# Patient Record
Sex: Female | Born: 1990 | Race: Black or African American | Hispanic: No | Marital: Single | Smoking: Never smoker
Health system: Southern US, Community
[De-identification: ages and names within clinical notes are randomized; demographics above are authoritative.]

## PROBLEM LIST (undated history)

## (undated) DIAGNOSIS — J45909 Unspecified asthma, uncomplicated: Secondary | ICD-10-CM

---

## 2014-07-31 ENCOUNTER — Emergency Department (HOSPITAL_COMMUNITY)
Admission: EM | Admit: 2014-07-31 | Discharge: 2014-08-01 | Disposition: A | Discharge status: Home / Self Care | Payer: 59 | Attending: Emergency Medicine | Admitting: Emergency Medicine

## 2014-07-31 ENCOUNTER — Encounter (HOSPITAL_COMMUNITY): Payer: Self-pay | Admitting: *Deleted

## 2014-07-31 ENCOUNTER — Emergency Department (HOSPITAL_COMMUNITY): Payer: 59

## 2014-07-31 DIAGNOSIS — R0602 Shortness of breath: Secondary | ICD-10-CM | POA: Diagnosis present

## 2014-07-31 DIAGNOSIS — J45901 Unspecified asthma with (acute) exacerbation: Secondary | ICD-10-CM | POA: Insufficient documentation

## 2014-07-31 HISTORY — DX: Unspecified asthma, uncomplicated: J45.909

## 2014-07-31 MED ORDER — ALBUTEROL SULFATE (2.5 MG/3ML) 0.083% IN NEBU
5.0000 mg | INHALATION_SOLUTION | Freq: Once | RESPIRATORY_TRACT | Status: AC
  Administered 2014-07-31: 5 mg via RESPIRATORY_TRACT
  Filled 2014-07-31: qty 6

## 2014-07-31 MED ORDER — IPRATROPIUM-ALBUTEROL 0.5-2.5 (3) MG/3ML IN SOLN
3.0000 mL | Freq: Once | RESPIRATORY_TRACT | Status: AC
  Administered 2014-07-31: 3 mL via RESPIRATORY_TRACT
  Filled 2014-07-31: qty 3

## 2014-07-31 MED ORDER — IPRATROPIUM BROMIDE 0.02 % IN SOLN
0.5000 mg | Freq: Once | RESPIRATORY_TRACT | Status: AC
  Administered 2014-07-31: 0.5 mg via RESPIRATORY_TRACT
  Filled 2014-07-31: qty 2.5

## 2014-07-31 MED ORDER — PREDNISONE 20 MG PO TABS
60.0000 mg | ORAL_TABLET | Freq: Once | ORAL | Status: AC
  Administered 2014-07-31: 60 mg via ORAL
  Filled 2014-07-31: qty 3

## 2014-07-31 NOTE — ED Notes (Signed)
The pt is an asthmatic and for the past 2 weeks she has been having difficulty breathing and having  A cough non-productive also.  She has some chest discomfort and her inhaler has expired.  No distress at present.  lmp jan 1st

## 2014-07-31 NOTE — ED Provider Notes (Signed)
CSN: [id number]     Arrival date & time 07/31/14  2136 History   First MD Initiated Contact with Patient 07/31/14 2216     Chief Complaint  Patient presents with  . Shortness of Breath     (Consider location/radiation/quality/duration/timing/severity/associated sxs/prior Treatment) HPI Comments: Patient is a 24 yo F PMHx significant for asthma presenting to the ED for two weeks of cough, chest tightness, and wheezing with little to no improvement with her albuterol inhaler at home. No modifying factors identified. Denies any fevers, chills, nausea, vomiting, abdominal pain diarrhea. PERC negative.   Patient is a 24 y.o. female presenting with shortness of breath.  Shortness of Breath Associated symptoms: cough and wheezing     Past Medical History  Diagnosis Date  . Asthma    History reviewed. No pertinent past surgical history. No family history on file. History  Substance Use Topics  . Smoking status: Never Smoker   . Smokeless tobacco: Not on file  . Alcohol Use: Yes   OB History    No data available     Review of Systems  Respiratory: Positive for cough, chest tightness, shortness of breath and wheezing.   All other systems reviewed and are negative.     Allergies  Review of patient's allergies indicates no known allergies.  Home Medications   Prior to Admission medications   Medication Sig Start Date End Date Taking? Authorizing Provider  albuterol (PROVENTIL HFA;VENTOLIN HFA) 108 (90 BASE) MCG/ACT inhaler Inhale 2 puffs into the lungs every 6 (six) hours as needed for wheezing or shortness of breath. 08/01/14   Nilda Keathley L Kaysey Berndt, PA-C  predniSONE (DELTASONE) 20 MG tablet Take 2 tablets (40 mg total) by mouth daily. 08/01/14   Kashius Dominic L Ruven Corradi, PA-C   BP 140/83 mmHg  Pulse 90  Temp(Src) 98.9 F (37.2 C) (Oral)  Resp 20  Ht 5\' 6"  (1.676 m)  Wt 192 lb (87.091 kg)  BMI 31.00 kg/m2  SpO2 98%  LMP 07/17/2014 Physical Exam  Constitutional: She is  oriented to person, place, and time. She appears well-developed and well-nourished. No distress.  HENT:  Head: Normocephalic and atraumatic.  Right Ear: External ear normal.  Left Ear: External ear normal.  Nose: Nose normal.  Mouth/Throat: Oropharynx is clear and moist. No oropharyngeal exudate.  Eyes: Conjunctivae are normal.  Neck: Normal range of motion. Neck supple.  Cardiovascular: Normal rate, regular rhythm, normal heart sounds and intact distal pulses.   Pulmonary/Chest: Effort normal. No respiratory distress. She has wheezes (scant expiratory). She exhibits no tenderness.  Abdominal: Soft. There is no tenderness.  Musculoskeletal: Normal range of motion. She exhibits no edema.  Neurological: She is alert and oriented to person, place, and time.  Skin: Skin is warm and dry. She is not diaphoretic.  Psychiatric: She has a normal mood and affect.  Nursing note and vitals reviewed.   ED Course  Procedures (including critical care time) Medications  albuterol (PROVENTIL) (2.5 MG/3ML) 0.083% nebulizer solution 5 mg (5 mg Nebulization Given 07/31/14 2156)  ipratropium (ATROVENT) nebulizer solution 0.5 mg (0.5 mg Nebulization Given 07/31/14 2156)  ipratropium-albuterol (DUONEB) 0.5-2.5 (3) MG/3ML nebulizer solution 3 mL (3 mLs Nebulization Given 07/31/14 2336)  predniSONE (DELTASONE) tablet 60 mg (60 mg Oral Given 07/31/14 2348)    Labs Review Labs Reviewed - No data to display  Imaging Review Dg Chest 2 View  07/31/2014   CLINICAL DATA:  Acute onset of shortness of breath. Initial encounter.  EXAM: CHEST  2 VIEW  COMPARISON:  None.  FINDINGS: The lungs are well-aerated and clear. There is no evidence of focal opacification, pleural effusion or pneumothorax.  The heart is normal in size; the mediastinal contour is within normal limits. No acute osseous abnormalities are seen. Bilateral metallic nipple piercings are noted.  IMPRESSION: No acute cardiopulmonary process seen.    Electronically Signed   By: Roanna Raider M.D.   On: 07/31/2014 23:17     EKG Interpretation None      MDM   Final diagnoses:  Asthma exacerbation    Filed Vitals:   08/01/14 0000  BP: 140/83  Pulse: 90  Temp:   Resp:    Afebrile, NAD, non-toxic appearing, AAOx4.  Patient in ED with O2 saturations maintained >90, no current signs of respiratory distress. Lung exam improved after nebulizer treatment. Prednisone given in the ED and pt will bd dc with 5 day burst. Pt states they are breathing at baseline. Pt has been instructed to continue using prescribed medications and to speak with PCP about today's exacerbation. Patient is stable at time of discharge      Jeannetta Ellis, PA-C 08/01/14 0116  Doug Sou, MD 08/01/14 [phone]

## 2014-07-31 NOTE — Discharge Instructions (Signed)
Please follow up with your primary care physician in 1-2 days. If you do not have one please call the [facility] number listed above. Please read all discharge instructions and return precautions.  ° ° °Asthma °Asthma is a recurring condition in which the airways tighten and narrow. Asthma can make it difficult to breathe. It can cause coughing, wheezing, and shortness of breath. Asthma episodes, also called asthma attacks, range from minor to life-threatening. Asthma cannot be cured, but medicines and lifestyle changes can help control it. °CAUSES °Asthma is believed to be caused by inherited (genetic) and environmental factors, but its exact cause is unknown. Asthma may be triggered by allergens, lung infections, or irritants in the air. Asthma triggers are different for each person. Common triggers include:  °· Animal dander. °· Dust mites. °· Cockroaches. °· Pollen from trees or grass. °· Mold. °· Smoke. °· Air pollutants such as dust, household cleaners, hair sprays, aerosol sprays, paint fumes, strong chemicals, or strong odors. °· Cold air, weather changes, and winds (which increase molds and pollens in the air). °· Strong emotional expressions such as crying or laughing hard. °· Stress. °· Certain medicines (such as aspirin) or types of drugs (such as beta-blockers). °· Sulfites in foods and drinks. Foods and drinks that may contain sulfites include dried fruit, potato chips, and sparkling grape juice. °· Infections or inflammatory conditions such as the flu, a cold, or an inflammation of the nasal membranes (rhinitis). °· Gastroesophageal reflux disease (GERD). °· Exercise or strenuous activity. °SYMPTOMS °Symptoms may occur immediately after asthma is triggered or many hours later. Symptoms include: °· Wheezing. °· Excessive nighttime or early morning coughing. °· Frequent or severe coughing with a common cold. °· Chest tightness. °· Shortness of breath. °DIAGNOSIS  °The diagnosis of  asthma is made by a review of your medical history and a physical exam. Tests may also be performed. These may include: °· Lung function studies. These tests show how much air you breathe in and out. °· Allergy tests. °· Imaging tests such as X-rays. °TREATMENT  °Asthma cannot be cured, but it can usually be controlled. Treatment involves identifying and avoiding your asthma triggers. It also involves medicines. There are 2 classes of medicine used for asthma treatment:  °· Controller medicines. These prevent asthma symptoms from occurring. They are usually taken every day. °· Reliever or rescue medicines. These quickly relieve asthma symptoms. They are used as needed and provide short-term relief. °Your health care provider will help you create an asthma action plan. An asthma action plan is a written plan for managing and treating your asthma attacks. It includes a list of your asthma triggers and how they may be avoided. It also includes information on when medicines should be taken and when their dosage should be changed. An action plan may also involve the use of a device called a peak flow meter. A peak flow meter measures how well the lungs are working. It helps you monitor your condition. °HOME CARE INSTRUCTIONS  °· Take medicines only as directed by your health care provider. Speak with your health care provider if you have questions about how or when to take the medicines. °· Use a peak flow meter as directed by your health care provider. Record and keep track of readings. °· Understand and use the action plan to help minimize or stop an asthma attack without needing to seek medical care. °· Control your home environment in the following ways to help prevent   asthma attacks: °¨ Do not smoke. Avoid being exposed to secondhand smoke. °¨ Change your heating and air conditioning filter regularly. °¨ Limit your use of fireplaces and wood stoves. °¨ Get rid of pests (such as roaches and mice) and their  droppings. °¨ Throw away plants if you see mold on them. °¨ Clean your floors and dust regularly. Use unscented cleaning products. °¨ Try to have someone else vacuum for you regularly. Stay out of rooms while they are being vacuumed and for a short while afterward. If you vacuum, use a dust mask from a hardware store, a double-layered or microfilter vacuum cleaner bag, or a vacuum cleaner with a HEPA filter. °¨ Replace carpet with wood, tile, or vinyl flooring. Carpet can trap dander and dust. °¨ Use allergy-proof pillows, mattress covers, and box spring covers. °¨ Wash bed sheets and blankets every week in hot water and dry them in a dryer. °¨ Use blankets that are made of polyester or cotton. °¨ Clean bathrooms and kitchens with bleach. If possible, have someone repaint the walls in these rooms with mold-resistant paint. Keep out of the rooms that are being cleaned and painted. °¨ Wash hands frequently. °SEEK MEDICAL CARE IF:  °· You have wheezing, shortness of breath, or a cough even if taking medicine to prevent attacks. °· The colored mucus you cough up (sputum) is thicker than usual. °· Your sputum changes from clear or white to yellow, green, gray, or bloody. °· You have any problems that may be related to the medicines you are taking (such as a rash, itching, swelling, or trouble breathing). °· You are using a reliever medicine more than 2-3 times per week. °· Your peak flow is still at 50-79% of your personal best after following your action plan for 1 hour. °· You have a fever. °SEEK IMMEDIATE MEDICAL CARE IF:  °· You seem to be getting worse and are unresponsive to treatment during an asthma attack. °· You are short of breath even at rest. °· You get short of breath when doing very little physical activity. °· You have difficulty eating, drinking, or talking due to asthma symptoms. °· You develop chest pain. °· You develop a fast heartbeat. °· You have a bluish color to your lips or fingernails. °· You  are light-headed, dizzy, or faint. °· Your peak flow is less than 50% of your personal best. °MAKE SURE YOU:  °· Understand these instructions. °· Will watch your condition. °· Will get help right away if you are not doing well or get worse. °Document Released: 07/03/2005 Document Revised: 11/17/2013 Document Reviewed: 01/30/2013 °[employer]® Patient Information ©2015 [employer]. This information is not intended to replace advice given to you by your health care provider. Make sure you discuss any questions you have with your health care provider. ° °

## 2014-08-01 MED ORDER — ALBUTEROL SULFATE HFA 108 (90 BASE) MCG/ACT IN AERS: 2.0000 | INHALATION_SPRAY | Freq: Four times a day (QID) | RESPIRATORY_TRACT | Status: DC | PRN

## 2014-08-01 MED ORDER — PREDNISONE 20 MG PO TABS: 40.0000 mg | ORAL_TABLET | Freq: Every day | ORAL | Status: DC

## 2014-10-16 ENCOUNTER — Encounter (HOSPITAL_COMMUNITY): Payer: Self-pay | Admitting: *Deleted

## 2014-10-16 ENCOUNTER — Emergency Department (HOSPITAL_COMMUNITY)
Admission: EM | Admit: 2014-10-16 | Discharge: 2014-10-17 | Disposition: A | Discharge status: Home / Self Care | Payer: 59 | Attending: Emergency Medicine | Admitting: Emergency Medicine

## 2014-10-16 DIAGNOSIS — L0390 Cellulitis, unspecified: Secondary | ICD-10-CM

## 2014-10-16 DIAGNOSIS — Z7952 Long term (current) use of systemic steroids: Secondary | ICD-10-CM | POA: Diagnosis not present

## 2014-10-16 DIAGNOSIS — L0231 Cutaneous abscess of buttock: Secondary | ICD-10-CM | POA: Insufficient documentation

## 2014-10-16 DIAGNOSIS — J45909 Unspecified asthma, uncomplicated: Secondary | ICD-10-CM | POA: Insufficient documentation

## 2014-10-16 DIAGNOSIS — L03317 Cellulitis of buttock: Secondary | ICD-10-CM | POA: Diagnosis not present

## 2014-10-16 DIAGNOSIS — Z79899 Other long term (current) drug therapy: Secondary | ICD-10-CM | POA: Diagnosis not present

## 2014-10-16 DIAGNOSIS — L0291 Cutaneous abscess, unspecified: Secondary | ICD-10-CM

## 2014-10-16 MED ORDER — LIDOCAINE-EPINEPHRINE (PF) 2 %-1:200000 IJ SOLN
10.0000 mL | Freq: Once | INTRAMUSCULAR | Status: AC
  Administered 2014-10-16: 10 mL
  Filled 2014-10-16: qty 20

## 2014-10-16 NOTE — ED Provider Notes (Signed)
CSN: [id number]     Arrival date & time 10/16/14  2200 History   None    Chief Complaint  Patient presents with  . Abscess   Patient is a [age] y.o. female presenting with abscess. The history is provided by the patient. No language interpreter was used.  Abscess  This chart was scribed for non-physician practitioner Marlon Peliffany Rayven Rettig, PA-C, working with Layla MawKristen N Ward, DO, by Andrew Auaven Small, ED Scribe. This patient was seen in room TR09C/TR09C and the patient's care was started at 12:10 AM.  Octavio Mannsaris Ghosh is a 24 y.o. female who presents to the Emergency Department complaining of an abscess to left buttocks that began 4 days ago. Pt believes she may have been bitten by an insect. She reports today is the worse abscess has been since she's first noticed it. She denies fever and chills. Denies fatigue, nausea, vomiting, diarrhea, abdominal pain, groin pain, back pain, headache, polyuria, or increased thirst. ( nurse note says right but I confirmed abscess is to left.)  Past Medical History  Diagnosis Date  . Asthma    History reviewed. No pertinent past surgical history. No family history on file. History  Substance Use Topics  . Smoking status: Never Smoker   . Smokeless tobacco: Not on file  . Alcohol Use: Yes   OB History    No data available     Review of Systems  Skin: Positive for wound.  All other systems reviewed and are negative.   Allergies  Review of patient's allergies indicates no known allergies.  Home Medications   Prior to Admission medications   Medication Sig Start Date End Date Taking? Authorizing Provider  albuterol (PROVENTIL HFA;VENTOLIN HFA) 108 (90 BASE) MCG/ACT inhaler Inhale 2 puffs into the lungs every 6 (six) hours as needed for wheezing or shortness of breath. 08/01/14   Jennifer Piepenbrink, PA-C  cephALEXin (KEFLEX) 500 MG capsule Take 1 capsule (500 mg total) by mouth 4 (four) times daily. 10/17/14   Marlon Peliffany Maryruth Apple, PA-C  HYDROcodone-acetaminophen  (NORCO/VICODIN) 5-325 MG per tablet Take 1 tablet by mouth every 6 (six) hours as needed. 10/17/14   Deztiny Sarra Neva SeatGreene, PA-C  predniSONE (DELTASONE) 20 MG tablet Take 2 tablets (40 mg total) by mouth daily. 08/01/14   Jennifer Piepenbrink, PA-C   BP 146/93 mmHg  Pulse 101  Temp(Src) 98.5 F (36.9 C)  Resp 18  Wt 219 lb 7 oz (99.536 kg)  SpO2 99%  LMP 09/15/2014 Physical Exam  Constitutional: She is oriented to person, place, and time. She appears well-developed and well-nourished. No distress.  HENT:  Head: Normocephalic and atraumatic.  Eyes: Conjunctivae and EOM are normal.  Neck: Neck supple.  Cardiovascular: Normal rate.   Pulmonary/Chest: Effort normal.  Genitourinary:     Musculoskeletal: Normal range of motion.  Neurological: She is alert and oriented to person, place, and time.  Skin: Skin is warm and dry.  Psychiatric: She has a normal mood and affect. Her behavior is normal.  Nursing note and vitals reviewed.   ED Course  Procedures (including critical care time) INCISION AND DRAINAGE PROCEDURE NOTE: Patient identification was confirmed and verbal consent was obtained. This procedure was performed by Marlon Peliffany Alizia Greif at 12:10 AM. Site: left lateral glut Sterile procedures observed: yes Needle size: 23 gauge Anesthetic used (type and amt): lidocaine 2% with epi Blade size: 12 inch Drainage: moderate Complexity: Complex Packing used yes Site anesthetized, incision made over site, wound drained and explored loculations, rinsed with copious amounts of normal saline,  wound packed with sterile gauze, covered with dry, sterile dressing.  Pt tolerated procedure well without complications.  Instructions for care discussed verbally and pt provided with additional written instructions for homecare and f/u.  DIAGNOSTIC STUDIES: Oxygen Saturation is 99% on RA, normal  by my interpretation.    COORDINATION OF CARE: 12:10 AM- Pt advised of plan for treatment which includes  I&D and pt agrees.  Labs Review Labs Reviewed - No data to display  Imaging Review No results found.   EKG Interpretation None      MDM   Final diagnoses:  Abscess and cellulitis    The patient has been instructed to return to the emergency department to have packing removed in 2 days and wound recheck. The patient has not had any fever, nausea, vomiting, increased thirst, or diarrhea. At discharge she had a temperature of 100.4 On arrival her temp is 98.5 with no medication. The patient was very tearful and crying during the procedure and became flushed- then her temp was taken quickly after. Her pulse improved from her original triage pulse of 101 to 93 instead of trending up. I do not believe the patient has a low grade temp due to infection. Pt will still receive Tylenol before she is discharged.  Will rx Keflex and pain medications. Pt is well appearing, non toxic and reports significant relief after procedure. Strict return to ED precautions given.  24 y.o.Yatziry Lassalle's evaluation in the Emergency Department is complete. It has been determined that no acute conditions requiring further emergency intervention are present at this time. The patient/guardian have been advised of the diagnosis and plan. We have discussed signs and symptoms that warrant return to the ED, such as changes or worsening in symptoms.  Vital signs are stable at discharge. Filed Vitals:   10/16/14 2203  BP: 146/93  Pulse: 101  Temp: 98.5 F (36.9 C)  Resp: 18    Patient/guardian has voiced understanding and agreed to follow-up with the PCP or specialist.  I personally performed the services described in this documentation, which was scribed in my presence. The recorded information has been reviewed and is accurate.    Marlon Pel, PA-C 10/17/14 0017  Layla Maw Ward, DO 10/17/14 1610

## 2014-10-16 NOTE — ED Notes (Signed)
The pt reports that she was bitten by an insect to her rt upper thigh 2 days ago.  Red swollen painful  Circle of redness  . No temp

## 2014-10-17 MED ORDER — ACETAMINOPHEN 325 MG PO TABS
650.0000 mg | ORAL_TABLET | Freq: Once | ORAL | Status: AC
  Administered 2014-10-17: 650 mg via ORAL
  Filled 2014-10-17: qty 2

## 2014-10-17 MED ORDER — CEPHALEXIN 500 MG PO CAPS: 500.0000 mg | ORAL_CAPSULE | Freq: Four times a day (QID) | ORAL | Status: DC

## 2014-10-17 MED ORDER — HYDROCODONE-ACETAMINOPHEN 5-325 MG PO TABS: 1.0000 | ORAL_TABLET | Freq: Four times a day (QID) | ORAL | Status: DC | PRN

## 2014-10-17 NOTE — Discharge Instructions (Signed)
Incision and Drainage Incision and drainage is a procedure in which a sac-like structure (cystic structure) is opened and drained. The area to be drained usually contains material such as pus, fluid, or blood.  LET YOUR CAREGIVER KNOW ABOUT:   Allergies to medicine.  Medicines taken, including vitamins, herbs, eyedrops, over-the-counter medicines, and creams.  Use of steroids (by mouth or creams).  Previous problems with anesthetics or numbing medicines.  History of bleeding problems or blood clots.  Previous surgery.  Other health problems, including diabetes and kidney problems.  Possibility of pregnancy, if this applies. RISKS AND COMPLICATIONS  Pain.  Bleeding.  Scarring.  Infection. BEFORE THE PROCEDURE  You may need to have an ultrasound or other imaging tests to see how large or deep your cystic structure is. Blood tests may also be used to determine if you have an infection or how severe the infection is. You may need to have a tetanus shot. PROCEDURE  The affected area is cleaned with a cleaning fluid. The cyst area will then be numbed with a medicine (local anesthetic). A small incision will be made in the cystic structure. A syringe or catheter may be used to drain the contents of the cystic structure, or the contents may be squeezed out. The area will then be flushed with a cleansing solution. After cleansing the area, it is often gently packed with a gauze or another wound dressing. Once it is packed, it will be covered with gauze and tape or some other type of wound dressing. AFTER THE PROCEDURE   Often, you will be allowed to go home right after the procedure.  You may be given antibiotic medicine to prevent or heal an infection.  If the area was packed with gauze or some other wound dressing, you will likely need to come back in 1 to 2 days to get it removed.  The area should heal in about 14 days. Document Released: 12/27/2000 Document Revised: 01/02/2012  Document Reviewed: 08/28/2011 [facility] Patient Information 2015 [place], [place]. This information is not intended to replace advice given to you by your health care provider. Make sure you discuss any questions you have with your health care provider.  Abscess An abscess is an infected area that contains a collection of pus and debris.It can occur in almost any part of the body. An abscess is also known as a furuncle or boil. CAUSES  An abscess occurs when tissue gets infected. This can occur from blockage of oil or sweat glands, infection of hair follicles, or a minor injury to the skin. As the body tries to fight the infection, pus collects in the area and creates pressure under the skin. This pressure causes pain. People with weakened immune systems have difficulty fighting infections and get certain abscesses more often.  SYMPTOMS Usually an abscess develops on the skin and becomes a painful mass that is red, warm, and tender. If the abscess forms under the skin, you may feel a moveable soft area under the skin. Some abscesses break open (rupture) on their own, but most will continue to get worse without care. The infection can spread deeper into the body and eventually into the bloodstream, causing you to feel ill.  DIAGNOSIS  Your caregiver will take your medical history and perform a physical exam. A sample of fluid may also be taken from the abscess to determine what is causing your infection. TREATMENT  Your caregiver may prescribe antibiotic medicines to fight the infection. However, taking antibiotics alone usually  does not cure an abscess. Your caregiver may need to make a small cut (incision) in the abscess to drain the pus. In some cases, gauze is packed into the abscess to reduce pain and to continue draining the area. HOME CARE INSTRUCTIONS   Only take over-the-counter or prescription medicines for pain, discomfort, or fever as directed by your caregiver.  If you were prescribed  antibiotics, take them as directed. Finish them even if you start to feel better.  If gauze is used, follow your caregiver's directions for changing the gauze.  To avoid spreading the infection:  Keep your draining abscess covered with a bandage.  Wash your hands well.  Do not share personal care items, towels, or whirlpools with others.  Avoid skin contact with others.  Keep your skin and clothes clean around the abscess.  Keep all follow-up appointments as directed by your caregiver. SEEK MEDICAL CARE IF:   You have increased pain, swelling, redness, fluid drainage, or bleeding.  You have muscle aches, chills, or a general ill feeling.  You have a fever. MAKE SURE YOU:   Understand these instructions.  Will watch your condition.  Will get help right away if you are not doing well or get worse. Document Released: 04/12/2005 Document Revised: 01/02/2012 Document Reviewed: 09/15/2011 [facility]ExitCare Patient Information 2015 [place], [place]. This information is not intended to replace advice given to you by your health care provider. Make sure you discuss any questions you have with your health care provider.  Cellulitis Cellulitis is an infection of the skin and the tissue beneath it. The infected area is usually red and tender. Cellulitis occurs most often in the arms and lower legs.  CAUSES  Cellulitis is caused by bacteria that enter the skin through cracks or cuts in the skin. The most common types of bacteria that cause cellulitis are staphylococci and streptococci. SIGNS AND SYMPTOMS   Redness and warmth.  Swelling.  Tenderness or pain.  Fever. DIAGNOSIS  Your health care provider can usually determine what is wrong based on a physical exam. Blood tests may also be done. TREATMENT  Treatment usually involves taking an antibiotic medicine. HOME CARE INSTRUCTIONS   Take your antibiotic medicine as directed by your health care provider. Finish the antibiotic even if  you start to feel better.  Keep the infected arm or leg elevated to reduce swelling.  Apply a warm cloth to the affected area up to 4 times per day to relieve pain.  Take medicines only as directed by your health care provider.  Keep all follow-up visits as directed by your health care provider. SEEK MEDICAL CARE IF:   You notice red streaks coming from the infected area.  Your red area gets larger or turns dark in color.  Your bone or joint underneath the infected area becomes painful after the skin has healed.  Your infection returns in the same area or another area.  You notice a swollen bump in the infected area.  You develop new symptoms.  You have a fever. SEEK IMMEDIATE MEDICAL CARE IF:   You feel very sleepy.  You develop vomiting or diarrhea.  You have a general ill feeling (malaise) with muscle aches and pains. MAKE SURE YOU:   Understand these instructions.  Will watch your condition.  Will get help right away if you are not doing well or get worse. Document Released: 04/12/2005 Document Revised: 11/17/2013 Document Reviewed: 09/18/2011 [facility]Exit[facility] Patient Information 2015 [place]ExitCare, [place]LLC. This information is not intended to replace  advice given to you by your health care provider. Make sure you discuss any questions you have with your health care provider. ° °

## 2014-11-12 ENCOUNTER — Emergency Department (HOSPITAL_COMMUNITY)
Admission: EM | Admit: 2014-11-12 | Discharge: 2014-11-12 | Disposition: A | Discharge status: Home / Self Care | Payer: 59 | Attending: Emergency Medicine | Admitting: Emergency Medicine

## 2014-11-12 ENCOUNTER — Encounter (HOSPITAL_COMMUNITY): Payer: Self-pay | Admitting: *Deleted

## 2014-11-12 DIAGNOSIS — Z7952 Long term (current) use of systemic steroids: Secondary | ICD-10-CM | POA: Insufficient documentation

## 2014-11-12 DIAGNOSIS — Z792 Long term (current) use of antibiotics: Secondary | ICD-10-CM | POA: Diagnosis not present

## 2014-11-12 DIAGNOSIS — Z79899 Other long term (current) drug therapy: Secondary | ICD-10-CM | POA: Insufficient documentation

## 2014-11-12 DIAGNOSIS — L02416 Cutaneous abscess of left lower limb: Secondary | ICD-10-CM | POA: Insufficient documentation

## 2014-11-12 DIAGNOSIS — R110 Nausea: Secondary | ICD-10-CM | POA: Insufficient documentation

## 2014-11-12 DIAGNOSIS — L0291 Cutaneous abscess, unspecified: Secondary | ICD-10-CM

## 2014-11-12 DIAGNOSIS — J45909 Unspecified asthma, uncomplicated: Secondary | ICD-10-CM | POA: Insufficient documentation

## 2014-11-12 MED ORDER — LIDOCAINE-EPINEPHRINE (PF) 2 %-1:200000 IJ SOLN
10.0000 mL | Freq: Once | INTRAMUSCULAR | Status: AC
  Administered 2014-11-12: 10 mL
  Filled 2014-11-12: qty 20

## 2014-11-12 MED ORDER — SULFAMETHOXAZOLE-TRIMETHOPRIM 800-160 MG PO TABS: 2.0000 | ORAL_TABLET | Freq: Two times a day (BID) | ORAL | Status: AC

## 2014-11-12 NOTE — ED Notes (Signed)
Declined W/C at D/C and was escorted to lobby by RN. 

## 2014-11-12 NOTE — ED Notes (Signed)
Pt in c/o possible insect bite to inside of left thigh, seen recently for similar symptoms on outside of thigh, no distress noted

## 2014-11-12 NOTE — ED Provider Notes (Signed)
CSN: [id number]     Arrival date & time 11/12/14  2020 History  This chart was scribed for Santiago Glad PA-C working with Lorre Nick, MD by Elveria Rising, ED Scribe. This patient was seen in room TR09C/TR09C and the patient's care was started at 8:51 PM.   Chief Complaint  Patient presents with  . Insect Bite   The history is provided by the patient. No language interpreter was used.   HPI Comments: Helen Henderson is a 24 y.o. female who presents to the Emergency Department complaining of a painful abscess to left medial thigh which developed two days ago. Patient reports exacerbated pain with applied pressure. No known injury or break in the skin.  Patient denies drainage from the site. Denies fever, chills, or vomiting.  Patient evaluated for abscess and surrounding cellulitis to left buttocks here in ED 10/16/14: treated with incision/drainage. Patient discharged with Keflex and Vicodin at that time.  Patient denies personal history of Diabetes.    Past Medical History  Diagnosis Date  . Asthma    History reviewed. No pertinent past surgical history. History reviewed. No pertinent family history. History  Substance Use Topics  . Smoking status: Never Smoker   . Smokeless tobacco: Not on file  . Alcohol Use: Yes   OB History    No data available     Review of Systems  Constitutional: Negative for fever.  Gastrointestinal: Positive for nausea.  Skin:       Abscess       Allergies  Review of patient's allergies indicates no known allergies.  Home Medications   Prior to Admission medications   Medication Sig Start Date End Date Taking? Authorizing Provider  albuterol (PROVENTIL HFA;VENTOLIN HFA) 108 (90 BASE) MCG/ACT inhaler Inhale 2 puffs into the lungs every 6 (six) hours as needed for wheezing or shortness of breath. 08/01/14   Jennifer Piepenbrink, PA-C  cephALEXin (KEFLEX) 500 MG capsule Take 1 capsule (500 mg total) by mouth 4 (four) times daily. 10/17/14   Marlon Pel, PA-C  HYDROcodone-acetaminophen (NORCO/VICODIN) 5-325 MG per tablet Take 1 tablet by mouth every 6 (six) hours as needed. 10/17/14   Tiffany Neva Seat, PA-C  predniSONE (DELTASONE) 20 MG tablet Take 2 tablets (40 mg total) by mouth daily. 08/01/14   Francee Piccolo, PA-C   Triage Vitals: BP 124/75 mmHg  Pulse 106  Temp(Src) 99.3 F (37.4 C) (Oral)  Resp 20  Wt 220 lb (99.791 kg)  SpO2 100%  LMP 10/30/2014 Physical Exam  Constitutional: She is oriented to person, place, and time. She appears well-developed and well-nourished. No distress.  HENT:  Head: Normocephalic and atraumatic.  Eyes: EOM are normal.  Neck: Neck supple. No tracheal deviation present.  Cardiovascular: Normal rate, regular rhythm and normal heart sounds.   Pulmonary/Chest: Effort normal and breath sounds normal. No respiratory distress.  Musculoskeletal: Normal range of motion.  Neurological: She is alert and oriented to person, place, and time.  Skin: Skin is warm and dry.  3cm abscessed area of the medial left upper thigh with surrounding erythema. No active drainage.   Psychiatric: She has a normal mood and affect. Her behavior is normal.  Nursing note and vitals reviewed.   ED Course  Procedures (including critical care time)  COORDINATION OF CARE: 8:59 PM- Plans to incise and drain. Discussed treatment plan with patient at bedside and patient agreed to plan.   Labs Review Labs Reviewed - No data to display  Imaging Review No results found.  EKG Interpretation None     INCISION AND DRAINAGE Performed by: Santiago GladLaisure, Akil Hoos Consent: Verbal consent obtained. Risks and benefits: risks, benefits and alternatives were discussed Type: abscess  Body area: left upper thigh  Anesthesia: local infiltration  Incision was made with a scalpel.  Local anesthetic: lidocaine 2% with epinephrine  Anesthetic total: 4 ml  Complexity: complex Blunt dissection to break up loculations  Drainage:  purulent  Drainage amount: moderate  Patient tolerance: Patient tolerated the procedure well with no immediate complications.    MDM   Final diagnoses:  None   Patient presents today with an abscess of the left upper thigh.  Area incised and drained in the ED.  Mild surrounding cellulitis.  She is afebrile and non toxic appearing.  Feel that the patient is stable for discharge.  Return precautions given.     Santiago GladHeather Naisha Wisdom, PA-C 11/12/14 [id number]  Lorre NickAnthony Allen, MD 11/13/14 [phone]

## 2015-01-10 ENCOUNTER — Encounter (HOSPITAL_COMMUNITY): Payer: Self-pay | Admitting: Adult Health

## 2015-01-10 ENCOUNTER — Emergency Department (HOSPITAL_COMMUNITY)
Admission: EM | Admit: 2015-01-10 | Discharge: 2015-01-10 | Disposition: A | Discharge status: Home / Self Care | Payer: 59 | Attending: Emergency Medicine | Admitting: Emergency Medicine

## 2015-01-10 DIAGNOSIS — Z7952 Long term (current) use of systemic steroids: Secondary | ICD-10-CM | POA: Insufficient documentation

## 2015-01-10 DIAGNOSIS — J029 Acute pharyngitis, unspecified: Secondary | ICD-10-CM | POA: Insufficient documentation

## 2015-01-10 DIAGNOSIS — Z792 Long term (current) use of antibiotics: Secondary | ICD-10-CM | POA: Diagnosis not present

## 2015-01-10 DIAGNOSIS — Z79899 Other long term (current) drug therapy: Secondary | ICD-10-CM | POA: Insufficient documentation

## 2015-01-10 DIAGNOSIS — J45909 Unspecified asthma, uncomplicated: Secondary | ICD-10-CM | POA: Insufficient documentation

## 2015-01-10 LAB — RAPID STREP SCREEN (MED CTR MEBANE ONLY): Streptococcus, Group A Screen (Direct): NEGATIVE

## 2015-01-10 MED ORDER — ACETAMINOPHEN-CODEINE 120-12 MG/5ML PO SOLN: 10.0000 mL | ORAL | Status: DC | PRN

## 2015-01-10 MED ORDER — AMOXICILLIN-POT CLAVULANATE 875-125 MG PO TABS: 1.0000 | ORAL_TABLET | Freq: Two times a day (BID) | ORAL | Status: DC

## 2015-01-10 NOTE — ED Notes (Signed)
Presents with "throat feels tight and hurts" taking benadryl with no relief and ibuprofen with relief of pain-worse at night, this AM pai nwas worse and the pain radiates into bilateral ears alos reports pain under neath tongue. Throat red, airway intact Sats 100%. Denies itching, endorses emesis x2 yestrday and bloody emesis.

## 2015-01-10 NOTE — ED Provider Notes (Signed)
CSN: [id number]     Arrival date & time 01/10/15  1123 History   First MD Initiated Contact with Patient 01/10/15 1153     Chief Complaint  Patient presents with  . Sore Throat     (Consider location/radiation/quality/duration/timing/severity/associated sxs/prior Treatment) HPI Patient presents to the emergency department with sore throat that started 2 days ago.  The patient states the pain got worse last night and she states that it hurts to swallow.  She has taken ibuprofen prior to arrival.  Patient states that nothing seems make her condition better.  Patient states that she also noted some lymph node swelling.  Patient, states she has had no chest pain, shortness of breath, headache, blurred vision, weakness, dizziness, abdominal pain, dysuria, incontinence, lightheadedness or syncope.  The patient states that she has had no coughing, runny nose or nasal congestion Past Medical History  Diagnosis Date  . Asthma    History reviewed. No pertinent past surgical history. History reviewed. No pertinent family history. History  Substance Use Topics  . Smoking status: Never Smoker   . Smokeless tobacco: Not on file  . Alcohol Use: Yes   OB History    No data available     Review of Systems All other systems negative except as documented in the HPI. All pertinent positives and negatives as reviewed in the HPI.   Allergies  Review of patient's allergies indicates no known allergies.  Home Medications   Prior to Admission medications   Medication Sig Start Date End Date Taking? Authorizing Provider  albuterol (PROVENTIL HFA;VENTOLIN HFA) 108 (90 BASE) MCG/ACT inhaler Inhale 2 puffs into the lungs every 6 (six) hours as needed for wheezing or shortness of breath. 08/01/14   Jennifer Piepenbrink, PA-C  cephALEXin (KEFLEX) 500 MG capsule Take 1 capsule (500 mg total) by mouth 4 (four) times daily. 10/17/14   Marlon Pel, PA-C  HYDROcodone-acetaminophen (NORCO/VICODIN) 5-325 MG per  tablet Take 1 tablet by mouth every 6 (six) hours as needed. 10/17/14   Tiffany Neva Seat, PA-C  predniSONE (DELTASONE) 20 MG tablet Take 2 tablets (40 mg total) by mouth daily. 08/01/14   Jennifer Piepenbrink, PA-C   BP 128/77 mmHg  Pulse 72  Temp(Src) 98.3 F (36.8 C) (Oral)  Resp 18  Ht 5\' 6"  (1.676 m)  Wt 220 lb (99.791 kg)  BMI 35.53 kg/m2  SpO2 100%  LMP 01/03/2015 Physical Exam  Constitutional: She is oriented to person, place, and time. She appears well-developed and well-nourished. No distress.  HENT:  Head: Normocephalic and atraumatic.  Mouth/Throat: Mucous membranes are normal. No oral lesions. No trismus in the jaw. Normal dentition. Uvula swelling present. No dental abscesses, lacerations or dental caries. Posterior oropharyngeal edema and posterior oropharyngeal erythema present. No oropharyngeal exudate or tonsillar abscesses.  Eyes: Pupils are equal, round, and reactive to light.  Neck: Normal range of motion. Neck supple.  Cardiovascular: Normal rate, regular rhythm and normal heart sounds.  Exam reveals no gallop and no friction rub.   No murmur heard. Pulmonary/Chest: Effort normal and breath sounds normal. No respiratory distress.  Abdominal: Soft. Bowel sounds are normal. She exhibits no distension. There is no tenderness.  Musculoskeletal: She exhibits no edema.  Neurological: She is alert and oriented to person, place, and time. She exhibits normal muscle tone. Coordination normal.  Skin: Skin is warm and dry. No rash noted. No erythema.  Psychiatric: She has a normal mood and affect. Her behavior is normal.  Nursing note and vitals reviewed.  ED Course  Procedures (including critical care time) Labs Review Labs Reviewed  RAPID STREP SCREEN (NOT AT [facility])  CULTURE, GROUP A STREP   A be treated for pharyngitis.  Told to return here as needed.  Advised to increase her fluid intake and rest as much possible   Charlestine Night, PA-C 01/10/15 1409  Benjiman Core, MD 01/10/15 1530

## 2015-01-10 NOTE — Discharge Instructions (Signed)
Return here as needed.  Follow-up with a primary care doctor, increase your fluid intake, rest as much as possible

## 2015-01-12 LAB — CULTURE, GROUP A STREP: Strep A Culture: NEGATIVE

## 2015-09-15 ENCOUNTER — Telehealth: Payer: Self-pay | Admitting: *Deleted

## 2015-09-15 NOTE — Telephone Encounter (Signed)
Unable to reach patient at time of pre-visit call. Left message for patient to return call when available.  

## 2015-09-16 ENCOUNTER — Telehealth: Payer: Self-pay | Admitting: Medical

## 2015-09-16 ENCOUNTER — Encounter: Payer: Self-pay | Admitting: Medical

## 2015-09-16 NOTE — Progress Notes (Signed)
This encounter was created in error - please disregard.

## 2015-09-17 NOTE — Telephone Encounter (Signed)
Charge and reshcedule with other.

## 2015-09-17 NOTE — Telephone Encounter (Signed)
Pt was no show 09/16/15 3:00pm for new pt appt, pt has not rescheduled, charge or no charge? Reschedule with you if pt calls?

## 2015-09-20 ENCOUNTER — Encounter: Payer: Self-pay | Admitting: Medical

## 2015-09-20 NOTE — Telephone Encounter (Signed)
Marked to charge, mailing no show letter °

## 2017-01-31 ENCOUNTER — Encounter (HOSPITAL_COMMUNITY): Payer: Self-pay | Admitting: *Deleted

## 2017-01-31 ENCOUNTER — Emergency Department (HOSPITAL_COMMUNITY)
Admission: EM | Admit: 2017-01-31 | Discharge: 2017-01-31 | Disposition: A | Discharge status: Home / Self Care | Payer: Managed Care, Other (non HMO) | Attending: Emergency Medicine | Admitting: Emergency Medicine

## 2017-01-31 DIAGNOSIS — J45909 Unspecified asthma, uncomplicated: Secondary | ICD-10-CM | POA: Diagnosis not present

## 2017-01-31 DIAGNOSIS — Z3202 Encounter for pregnancy test, result negative: Secondary | ICD-10-CM | POA: Diagnosis not present

## 2017-01-31 DIAGNOSIS — N939 Abnormal uterine and vaginal bleeding, unspecified: Secondary | ICD-10-CM | POA: Diagnosis present

## 2017-01-31 LAB — I-STAT BETA HCG BLOOD, ED (MC, WL, AP ONLY): I-stat hCG, quantitative: <5 m[IU]/mL (ref <5)

## 2017-01-31 MED ORDER — NORGESTIM-ETH ESTRAD TRIPHASIC 0.18/0.215/0.25 MG-25 MCG PO TABS: 1.0000 | ORAL_TABLET | Freq: Every day | ORAL | 1 refills | Status: DC

## 2017-01-31 NOTE — ED Triage Notes (Signed)
Pt reports lmp was 7/5 but it was light and only 2 days. Pt is requesting a pregnancy test for verification, denies any complaints.

## 2017-01-31 NOTE — ED Provider Notes (Signed)
[facility]-EMERGENCY DEPT Provider Note   CSN: [id number] Arrival date & time: 01/31/17  1816     History   Chief Complaint Chief Complaint  Patient presents with  . Possible Pregnancy    HPI Helen Henderson is a 26 y.o. female who presents to the emergency department who presents to the emergency department for abnormal test results. She reports she last had sexual intercourse on 6/13 and neither she nor her female partner used protection. She states that she had 5 days of light bleeding and spotting following intercourse, which is unusual for her. She reports that the first day of her LMP was 7/5 and last for 2-3 days and was lighter than usual. She states that she has very regular periods that are typically 5 days with the same amount bleeding. She states that she can't became concerned about her period was shorter and take an at-home pregnancy test yesterday and a second test today. She states that she is if the test was showing that she was pregnant or not, so she came to the emergency department for evaluation. She reports that she has been more tired for the last few weeks. She denies dysuria, hematuria, vaginal itching, pain, or discharge. No fever, chills, or abdominal pain. She is not currently taking birth control.   The history is provided by the patient. No language interpreter was used.    Past Medical History:  Diagnosis Date  . Asthma     There are no active problems to display for this patient.   History reviewed. No pertinent surgical history.  OB History    No data available       Home Medications    Prior to Admission medications   Medication Sig Start Date End Date Taking? Authorizing Provider  acetaminophen-codeine 120-12 MG/5ML solution Take 10 mLs by mouth every 4 (four) hours as needed for moderate pain. 01/10/15   Lawyer, Cristal Deer, PA-C  albuterol (PROVENTIL HFA;VENTOLIN HFA) 108 (90 BASE) MCG/ACT inhaler Inhale 2 puffs into the lungs every 6 (six) hours as  needed for wheezing or shortness of breath. 08/01/14   Piepenbrink, Victorino Dike, PA-C  amoxicillin-clavulanate (AUGMENTIN) 875-125 MG per tablet Take 1 tablet by mouth every 12 (twelve) hours. 01/10/15   Lawyer, Cristal Deer, PA-C  cephALEXin (KEFLEX) 500 MG capsule Take 1 capsule (500 mg total) by mouth 4 (four) times daily. Patient not taking: Reported on 01/10/2015 10/17/14   Marlon Pel, PA-C  diphenhydrAMINE (BENADRYL) 25 mg capsule Take 25 mg by mouth every 6 (six) hours as needed for allergies.    [provider]  HYDROcodone-acetaminophen (NORCO/VICODIN) 5-325 MG per tablet Take 1 tablet by mouth every 6 (six) hours as needed. Patient not taking: Reported on 01/10/2015 10/17/14   Marlon Pel, PA-C  Norgestimate-Ethinyl Estradiol Triphasic (ORTHO TRI-CYCLEN LO) 0.18/0.215/0.25 MG-25 MCG tab Take 1 tablet by mouth daily. 01/31/17   Breanah Faddis, Coral Else, PA-C    Family History History reviewed. No pertinent family history.  Social History Social History  Substance Use Topics  . Smoking status: Never Smoker  . Smokeless tobacco: Not on file  . Alcohol use Yes     Allergies   Patient has no known allergies.   Review of Systems Review of Systems  Constitutional: Positive for fatigue. Negative for chills and fever.  Gastrointestinal: Negative for abdominal pain.  Genitourinary: Positive for menstrual problem and vaginal bleeding (resolved). Negative for dysuria, vaginal discharge and vaginal pain.    Physical Exam Updated Vital Signs BP 123/74 (BP Location: Right  Arm)   Pulse 64   Temp 98.5 F (36.9 C) (Oral)   Resp 17   Ht 5\' 6"  (1.676 m)   Wt 95.3 kg (210 lb)   LMP 01/18/2017   SpO2 100%   BMI 33.89 kg/m   Physical Exam  Constitutional: She appears well-developed and well-nourished.  HENT:  Head: Normocephalic and atraumatic.  Eyes: No scleral icterus.  Neck: Neck supple.  Cardiovascular: Normal rate, regular rhythm and normal heart sounds.  Exam reveals no  gallop and no friction rub.   No murmur heard. Pulmonary/Chest: Effort normal and breath sounds normal. No respiratory distress. She has no wheezes. She has no rales.  Abdominal: Soft. Bowel sounds are normal. She exhibits no distension. There is no tenderness. There is no guarding.  Musculoskeletal: Normal range of motion.  Neurological: She is alert.  Skin: Skin is warm and dry.  Psychiatric: She has a normal mood and affect. Her behavior is normal.  Nursing note and vitals reviewed.   ED Treatments / Results  Labs (all labs ordered are listed, but only abnormal results are displayed) Labs Reviewed  I-STAT BETA HCG BLOOD, ED ([facility], [facility], [facility] ONLY)    EKG  EKG Interpretation None       Radiology No results found.  Procedures Procedures (including critical care time)  Medications Ordered in ED Medications - No data to display   Initial Impression / Assessment and Plan / ED Course  I have reviewed the triage vital signs and the nursing notes.  Pertinent labs & imaging results that were available during my care of the patient were reviewed by me and considered in my medical decision making (see chart for details).     Patient presenting to the emergency department requesting a pregnancy test. She reports that she took 2 at home test and is unsure of the results indicated that she was positive or negative for pregnancy. Pregnancy test in the ED negative at this time. The patient has no other medical complaints at this time. Physical exam unremarkable. The patient is not currently on birth control and voices that she would like to initiate oral contraceptives. Will provide the patient with a 1 month supply of oral contraceptives and referral to OB/GYN. The patient acknowledges the plan is agreeable at this time. No acute distress. Vital signs stable. The patient states for discharge at this time.  Final Clinical Impressions(s) / ED Diagnoses   Final diagnoses:  Negative  pregnancy test    New Prescriptions Discharge Medication List as of 01/31/2017  8:13 PM    START taking these medications   Details  Norgestimate-Ethinyl Estradiol Triphasic (ORTHO TRI-CYCLEN LO) 0.18/0.215/0.25 MG-25 MCG tab Take 1 tablet by mouth daily., Starting Wed 01/31/2017, Print         Angelmarie Ponzo A, PA-C 01/31/17 2026    Tegeler, Canary Brimhristopher J, MD 02/01/17 1339

## 2017-01-31 NOTE — Discharge Instructions (Signed)
You can start taking birth control pills today. Please know that if you have sex in the next 7 days that he should use a backup method of contraception or protection, including a condom. Please take one pill daily at the same time every day to increase the effectiveness of this medication. You call the number for the OB/GYN on her discharge paperwork to get established with a provider or call the number to get established with a primary care provider. If you have new or worsening symptoms, including severe vaginal bleeding, vaginal discharge, fever, chills, or severe abdominal pain, please return to the emergency department for reevaluation.

## 2018-07-17 HISTORY — PX: MENISCUS REPAIR: SHX5179

## 2018-12-11 ENCOUNTER — Encounter (HOSPITAL_COMMUNITY): Payer: Self-pay | Admitting: *Deleted

## 2018-12-11 ENCOUNTER — Other Ambulatory Visit: Payer: Self-pay

## 2018-12-11 ENCOUNTER — Emergency Department (HOSPITAL_COMMUNITY)
Admission: EM | Admit: 2018-12-11 | Discharge: 2018-12-11 | Disposition: A | Discharge status: Home / Self Care | Payer: BLUE CROSS/BLUE SHIELD | Attending: Emergency Medicine | Admitting: Emergency Medicine

## 2018-12-11 ENCOUNTER — Emergency Department (HOSPITAL_COMMUNITY): Payer: BLUE CROSS/BLUE SHIELD

## 2018-12-11 DIAGNOSIS — Z8709 Personal history of other diseases of the respiratory system: Secondary | ICD-10-CM | POA: Insufficient documentation

## 2018-12-11 DIAGNOSIS — G8921 Chronic pain due to trauma: Secondary | ICD-10-CM | POA: Insufficient documentation

## 2018-12-11 DIAGNOSIS — Z793 Long term (current) use of hormonal contraceptives: Secondary | ICD-10-CM | POA: Insufficient documentation

## 2018-12-11 DIAGNOSIS — M25561 Pain in right knee: Secondary | ICD-10-CM | POA: Diagnosis present

## 2018-12-11 MED ORDER — MELOXICAM 7.5 MG PO TABS: 7.5000 mg | ORAL_TABLET | Freq: Every day | ORAL | 0 refills | Status: DC

## 2018-12-11 NOTE — ED Notes (Signed)
Patient verbalizes understanding of discharge instructions . Opportunity for questions and answers were provided . Armband removed by staff ,Pt discharged from ED. W/C  offered at D/C  and Declined W/C at D/C and was escorted to lobby by RN.  

## 2018-12-11 NOTE — ED Triage Notes (Signed)
Pt reports twisting knee last night and has knee pain. Pt first twisted the rt knee on March 30 and had a virtual  Visit with a Charity fundraiser .at that time  Pt was instructed to wrap knee and apply ice to knee.

## 2018-12-11 NOTE — Discharge Instructions (Addendum)
I suspect you have a soft tissue injury to your knee, possibly a meniscus injury.  You will need to see an orthopedic doctor to confirm this diagnosis and for treatment.  Take Mobic once daily as prescribed.  Do not take this medication if you have any concern of pregnancy.  You can alternate with Tylenol as prescribed over-the-counter, as needed for your pain.  Use ice 3-4 times daily alternating 20 minutes on, 20 minutes off.  Please follow-up with the orthopedic doctor below for further evaluation and treatment of your knee injury.  Please return to the emergency department if you develop any new or worsening symptoms.

## 2018-12-11 NOTE — ED Notes (Signed)
Patient transported to X-ray 

## 2018-12-11 NOTE — ED Provider Notes (Signed)
[facility] EMERGENCY DEPARTMENT Provider Note   CSN: [id number] Arrival date & time: 12/11/18  1448    History   Chief Complaint Chief Complaint  Patient presents with  . Knee Pain    HPI Helen Henderson is a 28 y.o. female with history of childhood asthma who presents with a 77-month history of right knee pain.  Patient reports she had a twisting injury and has had 2 subsequent twisting injuries where the pain returned.  Patient reports she saw a nurse over a telemedicine platform right after it happened the first time and was told it may have been a sprain.  She was told to wear brace and use ice.  She is continued to have pain in the medial aspect of her right knee.  It sometimes radiates down to her heel.  She denies any fevers or numbness or tingling.  She has taken Tylenol at home without relief.     HPI  Past Medical History:  Diagnosis Date  . Asthma     There are no active problems to display for this patient.   History reviewed. No pertinent surgical history.   OB History   No obstetric history on file.      Home Medications    Prior to Admission medications   Medication Sig Start Date End Date Taking? Authorizing Provider  acetaminophen-codeine 120-12 MG/5ML solution Take 10 mLs by mouth every 4 (four) hours as needed for moderate pain. 01/10/15   Lawyer, Cristal Deer, PA-C  albuterol (PROVENTIL HFA;VENTOLIN HFA) 108 (90 BASE) MCG/ACT inhaler Inhale 2 puffs into the lungs every 6 (six) hours as needed for wheezing or shortness of breath. 08/01/14   Piepenbrink, Victorino Dike, PA-C  amoxicillin-clavulanate (AUGMENTIN) 875-125 MG per tablet Take 1 tablet by mouth every 12 (twelve) hours. 01/10/15   Lawyer, Cristal Deer, PA-C  cephALEXin (KEFLEX) 500 MG capsule Take 1 capsule (500 mg total) by mouth 4 (four) times daily. Patient not taking: Reported on 01/10/2015 10/17/14   Marlon Pel, PA-C  diphenhydrAMINE (BENADRYL) 25 mg capsule Take 25 mg by mouth  every 6 (six) hours as needed for allergies.    [provider]  HYDROcodone-acetaminophen (NORCO/VICODIN) 5-325 MG per tablet Take 1 tablet by mouth every 6 (six) hours as needed. Patient not taking: Reported on 01/10/2015 10/17/14   Marlon Pel, PA-C  meloxicam (MOBIC) 7.5 MG tablet Take 1 tablet (7.5 mg total) by mouth daily. 12/11/18   Pilar Corrales, Waylan Boga, PA-C  Norgestimate-Ethinyl Estradiol Triphasic (ORTHO TRI-CYCLEN LO) 0.18/0.215/0.25 MG-25 MCG tab Take 1 tablet by mouth daily. 01/31/17   McDonald, Coral Else, PA-C    Family History History reviewed. No pertinent family history.  Social History Social History   Tobacco Use  . Smoking status: Never Smoker  . Smokeless tobacco: Never Used  Substance Use Topics  . Alcohol use: Yes  . Drug use: Never     Allergies   Patient has no known allergies.   Review of Systems Review of Systems  Constitutional: Negative for fever.  Musculoskeletal: Positive for arthralgias. Negative for joint swelling.  Neurological: Negative for numbness.     Physical Exam Updated Vital Signs BP 128/70 (BP Location: Right Arm)   Pulse 83   Temp 97.9 F (36.6 C) (Oral)   Resp 16   Ht  (1.676 m)   Wt 92.5 kg   LMP 12/04/2018   SpO2 97%   BMI 32.93 kg/m   Physical Exam Vitals signs and nursing note reviewed.  Constitutional:  General: She is not in acute distress.    Appearance: She is well-developed. She is not diaphoretic.  HENT:     Head: Normocephalic and atraumatic.     Mouth/Throat:     Pharynx: No oropharyngeal exudate.  Eyes:     General: No scleral icterus.       Right eye: No discharge.        Left eye: No discharge.     Conjunctiva/sclera: Conjunctivae normal.     Pupils: Pupils are equal, round, and reactive to light.  Neck:     Musculoskeletal: Normal range of motion and neck supple.     Thyroid: No thyromegaly.  Cardiovascular:     Rate and Rhythm: Normal rate and regular rhythm.     Heart sounds:  Normal heart sounds. No murmur. No friction rub. No gallop.   Pulmonary:     Effort: Pulmonary effort is normal. No respiratory distress.     Breath sounds: Normal breath sounds. No stridor. No wheezing or rales.  Abdominal:     General: Bowel sounds are normal. There is no distension.     Palpations: Abdomen is soft.     Tenderness: There is no abdominal tenderness. There is no guarding or rebound.  Musculoskeletal:     Comments: R knee: Medial joint line tenderness; negative anterior drawer, minimal pain with posterior drawer; significant pain with McMurray's test; no pain or laxity with varus or valgus stress; no warmth or erythema; no significant pain with passive range of motion  Lymphadenopathy:     Cervical: No cervical adenopathy.  Skin:    General: Skin is warm and dry.     Coloration: Skin is not pale.     Findings: No rash.  Neurological:     Mental Status: She is alert.     Coordination: Coordination normal.      ED Treatments / Results  Labs (all labs ordered are listed, but only abnormal results are displayed) Labs Reviewed - No data to display  EKG None  Radiology Dg Knee Complete 4 Views Right  Result Date: 12/11/2018 CLINICAL DATA:  Right knee pain due to recurrent twisting injuries over the past 2 months, most recently 12/10/2018. Initial encounter. EXAM: RIGHT KNEE - COMPLETE 4+ VIEW COMPARISON:  None. FINDINGS: No evidence of fracture, dislocation, or joint effusion. No evidence of arthropathy or other focal bone abnormality. Soft tissues are unremarkable. IMPRESSION: Normal exam. Electronically Signed   By: Drusilla Kannerhomas  Dalessio M.D.   On: 12/11/2018 16:06    Procedures Procedures (including critical care time)  Medications Ordered in ED Medications - No data to display   Initial Impression / Assessment and Plan / ED Course  I have reviewed the triage vital signs and the nursing notes.  Pertinent labs & imaging results that were available during my care  of the patient were reviewed by me and considered in my medical decision making (see chart for details).        Patient presenting with a 4146-month history of knee pain that began after a twisting injury.  She has medial pain and tenderness in her right knee.  X-ray is negative.  No suspicion of septic joint.  Suspect soft tissue injury, most likely meniscus injury.  Patient already has a brace at home.  Will start Mobic and alternate with Tylenol.  Elevation and ice discussed.  Follow-up to orthopedics for further evaluation.  Patient understands and agrees with plan.  Patient vital stable throughout ED course and discharged in  satisfactory condition.  Final Clinical Impressions(s) / ED Diagnoses   Final diagnoses:  Acute pain of right knee    ED Discharge Orders         Ordered    meloxicam (MOBIC) 7.5 MG tablet  Daily     12/11/18 1613           Emi Holes, PA-C 12/11/18 1618    Linwood Dibbles, MD 12/14/18 1259

## 2018-12-12 ENCOUNTER — Encounter: Payer: Self-pay | Admitting: Orthopedic Surgery

## 2018-12-12 ENCOUNTER — Ambulatory Visit (INDEPENDENT_AMBULATORY_CARE_PROVIDER_SITE_OTHER): Payer: BLUE CROSS/BLUE SHIELD | Admitting: Orthopedic Surgery

## 2018-12-12 DIAGNOSIS — M25569 Pain in unspecified knee: Secondary | ICD-10-CM

## 2018-12-12 DIAGNOSIS — S83511A Sprain of anterior cruciate ligament of right knee, initial encounter: Secondary | ICD-10-CM

## 2018-12-12 NOTE — Progress Notes (Signed)
Office Visit Note   Patient: Helen Henderson           Date of Birth: 03/04/1991           MRN: [id number] Visit Date: 12/12/2018 Requested by: No referring provider defined for this encounter. PCP: Patient, No Pcp Per  Subjective: Chief Complaint  Patient presents with  . Right Knee - Pain    HPI: Helen Henderson is a patient with right knee pain 2 months duration.  She was doing a cartwheel when she injured her knee and had an instability type episode.  Now she has essentially a locked knee and cannot fully extend the knee.  She also describes right knee popping.  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  Has been taking Mobic.  No relief with Tylenol.              ROS: All systems reviewed are negative as they relate to the chief complaint within the history of present illness.  Patient denies  fevers or chills.   Assessment & Plan: Visit Diagnoses:  1. Knee pain, unspecified chronicity, unspecified laterality   2. Rupture of anterior cruciate ligament of right knee, initial encounter     Plan: Impression is right knee pain which locked knee at this time.  I think she has medial joint line tenderness and may have some laxity but I cannot get a great exam today due to guarding.  I would favor MRI scanning to evaluate for possible locked medial meniscal tear and ACL strain versus tear.  I will see her back after that study.  She is can be out of work for at least 2 weeks.  Brace is also given for stability.  Follow-Up Instructions: Return for after MRI.   Orders:  Orders Placed This Encounter  Procedures  . MR Knee Right w/o contrast   No orders of the defined types were placed in this encounter.     Procedures: No procedures performed   Clinical Data: No additional findings.  Objective: Vital Signs: LMP 12/04/2018   Physical Exam:   Constitutional: Patient appears well-developed HEENT:  Head: Normocephalic Eyes:EOM are normal Neck: Normal range of motion  Cardiovascular: Normal rate Pulmonary/chest: Effort normal Neurologic: Patient is alert Skin: Skin is warm Psychiatric: Patient has normal mood and affect    Ortho Exam: Ortho exam demonstrates full active and passive range of motion of the left knee.  Right knee lacks 20 degrees of full extension but has good collateral ligament stability.  I think she does have some anterior laxity plus mild effusion.  She has discrete medial joint line tenderness but no lateral joint line tenderness.  Pedal pulses palpable.  Extensor mechanism is intact.  Antalgic gait to the right noted.  Specialty Comments:  No specialty comments available.  Imaging: Dg Knee Complete 4 Views Right  Result Date: 12/11/2018 CLINICAL DATA:  Right knee pain due to recurrent twisting injuries over the past 2 months, most recently 12/10/2018. Initial encounter. EXAM: RIGHT KNEE - COMPLETE 4+ VIEW COMPARISON:  None. FINDINGS: No evidence of fracture, dislocation, or joint effusion. No evidence of arthropathy or other focal bone abnormality. Soft tissues are unremarkable. IMPRESSION: Normal exam. Electronically Signed   By: Drusilla Kannerhomas  Dalessio M.D.   On: 12/11/2018 16:06     PMFS History: There are no active problems to display for this patient.  Past Medical History:  Diagnosis Date  . Asthma     History reviewed. No pertinent family history.  History  reviewed. No pertinent surgical history. Social History   Occupational History  . Not on file  Tobacco Use  . Smoking status: Never Smoker  . Smokeless tobacco: Never Used  Substance and Sexual Activity  . Alcohol use: Yes  . Drug use: Never  . Sexual activity: Not on file

## 2018-12-15 ENCOUNTER — Telehealth: Payer: Self-pay | Admitting: Internal Medicine

## 2018-12-15 NOTE — Telephone Encounter (Signed)
Left message for patient to return call today to [phone], [facility] to discuss 12/12/18 visit to [facility].

## 2018-12-21 ENCOUNTER — Ambulatory Visit
Admission: RE | Admit: 2018-12-21 | Discharge: 2018-12-21 | Disposition: A | Discharge status: Home / Self Care | Payer: BLUE CROSS/BLUE SHIELD | Source: Ambulatory Visit | Attending: Orthopedic Surgery | Admitting: Orthopedic Surgery

## 2018-12-21 ENCOUNTER — Other Ambulatory Visit: Payer: Self-pay

## 2018-12-21 DIAGNOSIS — M25569 Pain in unspecified knee: Secondary | ICD-10-CM

## 2018-12-27 ENCOUNTER — Other Ambulatory Visit: Payer: Self-pay

## 2018-12-27 ENCOUNTER — Encounter: Payer: Self-pay | Admitting: Orthopedic Surgery

## 2018-12-27 ENCOUNTER — Ambulatory Visit (INDEPENDENT_AMBULATORY_CARE_PROVIDER_SITE_OTHER): Payer: BC Managed Care – PPO | Admitting: Orthopedic Surgery

## 2018-12-27 VITALS — Ht 66.0 in | Wt 200.0 lb

## 2018-12-27 DIAGNOSIS — S838X1S Sprain of other specified parts of right knee, sequela: Secondary | ICD-10-CM | POA: Diagnosis not present

## 2018-12-31 ENCOUNTER — Encounter: Payer: Self-pay | Admitting: Orthopedic Surgery

## 2018-12-31 NOTE — Progress Notes (Signed)
Office Visit Note   Patient: Helen Henderson           Date of Birth: Nov 23, 1990           MRN: [id number] Visit Date: 12/27/2018 Requested by: No referring provider defined for this encounter. PCP: Patient, No Pcp Per  Subjective: Chief Complaint  Patient presents with  . Right Knee - Follow-up    MRI Review    HPI: Helen Henderson is a patient with right knee pain.  She states she is a little bit better.  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  She has been taking Mobic as well.  Reports medial sided pain only.  MRI scan is reviewed with the patient and it does show unstable medial meniscal tear with flap component.  She has been working.  No personal or family history of DVT or pulmonary embolism              ROS: All systems reviewed are negative as they relate to the chief complaint within the history of present illness.  Patient denies  fevers or chills.   Assessment & Plan: Visit Diagnoses:  1. Meniscal injury, right, sequela     Plan: Impression is right knee pain with medial meniscal tear.  Plan is diagnostic and operative arthroscopy with meniscal debridement.  This does not look like it is a repairable meniscus.  The risk benefits are discussed with the patient including not limited to infection nerve vessel damage incomplete pain relief as well as potential for swelling and arthritis development down the road.  Patient understands the risk and benefits and wishes to proceed.  In general I think we should be able to leave her with the least half of a functional meniscus which would give her a good chance that avoiding the long-term sequelae of meniscal deficient knees.  Follow-Up Instructions: No follow-ups on file.   Orders:  No orders of the defined types were placed in this encounter.  No orders of the defined types were placed in this encounter.     Procedures: No procedures performed   Clinical Data: No additional findings.  Objective:  Vital Signs: Ht 5\' 6"  (1.676 m)   Wt 200 lb (90.7 kg)   LMP 12/04/2018   BMI 32.28 kg/m   Physical Exam:   Constitutional: Patient appears well-developed HEENT:  Head: Normocephalic Eyes:EOM are normal Neck: Normal range of motion Cardiovascular: Normal rate Pulmonary/chest: Effort normal Neurologic: Patient is alert Skin: Skin is warm Psychiatric: Patient has normal mood and affect    Ortho Exam: Orthopedic exam demonstrates full active and passive range of motion of the right knee with medial joint line tenderness and positive McMurray compression testing.  Collateral crucial ligaments are stable.  Extensor mechanism is intact.  No other masses lymphadenopathy or skin changes noted in that right knee region.  Specialty Comments:  No specialty comments available.  Imaging: No results found.   PMFS History: There are no active problems to display for this patient.  Past Medical History:  Diagnosis Date  . Asthma     History reviewed. No pertinent family history.  History reviewed. No pertinent surgical history. Social History   Occupational History  . Not on file  Tobacco Use  . Smoking status: Never Smoker  . Smokeless tobacco: Never Used  Substance and Sexual Activity  . Alcohol use: Yes  . Drug use: Never  . Sexual activity: Not on file

## 2019-01-06 ENCOUNTER — Encounter: Payer: Self-pay | Admitting: Orthopedic Surgery

## 2019-01-06 DIAGNOSIS — M23321 Other meniscus derangements, posterior horn of medial meniscus, right knee: Secondary | ICD-10-CM | POA: Diagnosis not present

## 2019-01-13 ENCOUNTER — Telehealth: Payer: Self-pay | Admitting: Radiology

## 2019-01-13 ENCOUNTER — Encounter: Payer: Self-pay | Admitting: Orthopedic Surgery

## 2019-01-13 ENCOUNTER — Ambulatory Visit (INDEPENDENT_AMBULATORY_CARE_PROVIDER_SITE_OTHER): Payer: BC Managed Care – PPO | Admitting: Orthopedic Surgery

## 2019-01-13 ENCOUNTER — Other Ambulatory Visit: Payer: Self-pay

## 2019-01-13 DIAGNOSIS — S838X1S Sprain of other specified parts of right knee, sequela: Secondary | ICD-10-CM

## 2019-01-13 NOTE — Telephone Encounter (Signed)
Patient called and states that she is post op from meniscal repair on 01/06/2019. She only took Oxycodone x 2 days and started developing hives. She states that she discontinued all medications and started taking Benadryl and using Benadryl cream on 6/24. The rash seems to be spreading and has now moved to her legs.    Patient made work in appt for this afternoon.

## 2019-01-15 ENCOUNTER — Encounter: Payer: Self-pay | Admitting: Orthopedic Surgery

## 2019-01-15 ENCOUNTER — Inpatient Hospital Stay: Payer: BC Managed Care – PPO | Admitting: Orthopedic Surgery

## 2019-01-15 NOTE — Progress Notes (Signed)
   Post-Op Visit Note   Patient: Helen Henderson           Date of Birth: Mar 03, 1991           MRN: [id number] Visit Date: 01/13/2019 PCP: Patient, No Pcp Per   Assessment & Plan:  Chief Complaint:  Chief Complaint  Patient presents with  . Right Leg - Routine Post Op   Visit Diagnoses: No diagnosis found.  Plan: Helen Henderson is a patient is now a week out from right knee arthroscopy and partial meniscectomy.  Exam she has only trace effusion is 90.  Oxycodone gave her a rash.  Steri-Strips applied and sutures removed.  Stationary bike for range of motion and strengthening.  I think she is good to be able to go back to work in about 4 weeks.  I will see her back at that time  Follow-Up Instructions: Return in about 4 weeks (around 02/10/2019).   Orders:  No orders of the defined types were placed in this encounter.  No orders of the defined types were placed in this encounter.   Imaging: No results found.  PMFS History: There are no active problems to display for this patient.  Past Medical History:  Diagnosis Date  . Asthma     History reviewed. No pertinent family history.  History reviewed. No pertinent surgical history. Social History   Occupational History  . Not on file  Tobacco Use  . Smoking status: Never Smoker  . Smokeless tobacco: Never Used  Substance and Sexual Activity  . Alcohol use: Yes  . Drug use: Never  . Sexual activity: Not on file

## 2019-02-10 ENCOUNTER — Ambulatory Visit (INDEPENDENT_AMBULATORY_CARE_PROVIDER_SITE_OTHER): Payer: BC Managed Care – PPO | Admitting: Orthopedic Surgery

## 2019-02-10 ENCOUNTER — Encounter: Payer: Self-pay | Admitting: Orthopedic Surgery

## 2019-02-10 ENCOUNTER — Other Ambulatory Visit: Payer: Self-pay

## 2019-02-10 DIAGNOSIS — S838X1S Sprain of other specified parts of right knee, sequela: Secondary | ICD-10-CM

## 2019-02-10 NOTE — Progress Notes (Signed)
   Post-Op Visit Note   Patient: Helen Henderson           Date of Birth: [birth year]           MRN: [id number] Visit Date: 02/10/2019 PCP: Patient, No Pcp Per   Assessment & Plan:  Chief Complaint: No chief complaint on file.  Visit Diagnoses: No diagnosis found.  Plan: Pt is a 28 y.o. Female who presents 5 weeks s/p right knee arthroscopy and partial meniscectomy.  On exam, she has no effusion and her flexion is equivalent to her L knee.  Less than 1 cm of atrophy of the R quad.  She has graduated from stationary bike to road-biking in the last month and has been jogging occasionally in the last week or two without any pain.  She only complains of pain with kneeling but states that this is more of a soreness.  Pt is doing very well and she will f/u with the clinic prn.  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    Follow-Up Instructions: No follow-ups on file.   Orders:  No orders of the defined types were placed in this encounter.  No orders of the defined types were placed in this encounter.   Imaging: No results found.  PMFS History: There are no active problems to display for this patient.  Past Medical History:  Diagnosis Date  . Asthma     No family history on file.  No past surgical history on file. Social History   Occupational History  . Not on file  Tobacco Use  . Smoking status: Never Smoker  . Smokeless tobacco: Never Used  Substance and Sexual Activity  . Alcohol use: Yes  . Drug use: Never  . Sexual activity: Not on file

## 2020-10-26 IMAGING — MR MRI OF THE RIGHT KNEE WITHOUT CONTRAST
4 of 6 series · 21 of 40 positions shown · non-contrast
Comparison: Right knee x-rays dated December 11, 2018.

CLINICAL DATA: Persistent knee pain and swelling since cartwheel
injury 2 months ago.

EXAM:
MRI OF THE RIGHT KNEE WITHOUT CONTRAST
TECHNIQUE: Multiplanar, multisequence MR imaging of the knee was performed. No
intravenous contrast was administered.

[Series 3: T2 fat-sat · axial · 4.0mm · 0.50mm/px · z∈[-78,+51]mm · 3 of 27 slices shown (1 of 2)]
[im 1/27]
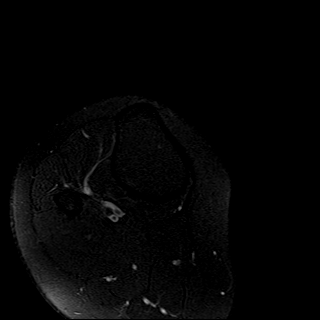
[im 14/27]
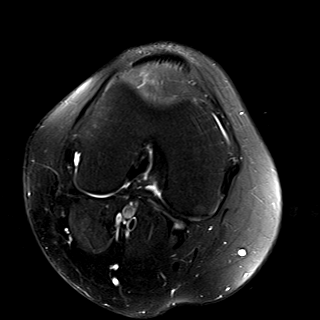
[im 27/27]
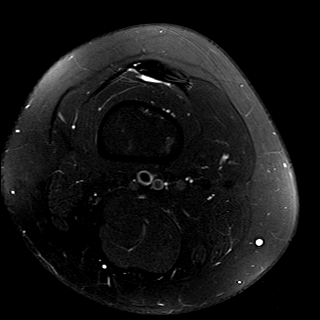

[Series 5: T2 fat-sat · coronal · 4.0mm · 0.29mm/px · 3 of 28 slices shown (2 of 2)]
[im 5/28]
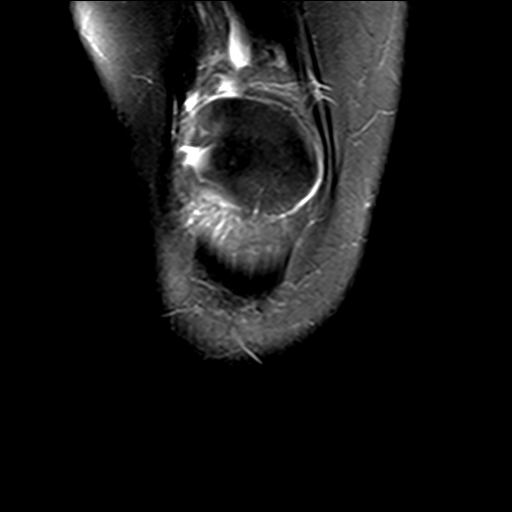
[im 14/28]
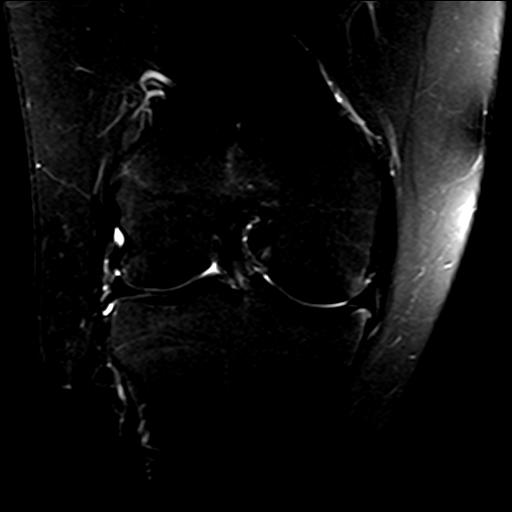
[im 23/28]
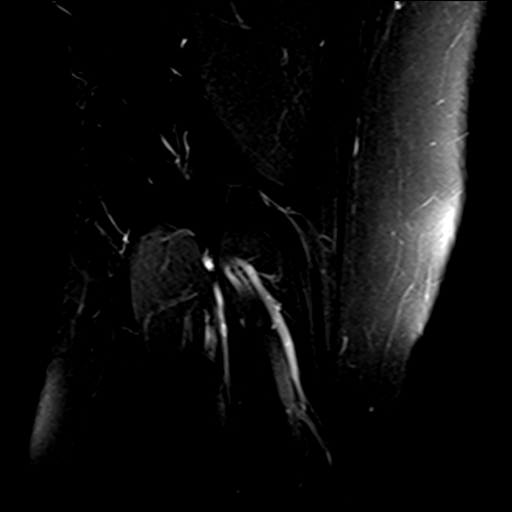

[Series 7: PD fat-sat · sagittal · 3.0mm · 0.29mm/px · 7 of 28 slices shown (1 of 2)]
[im 1/28]
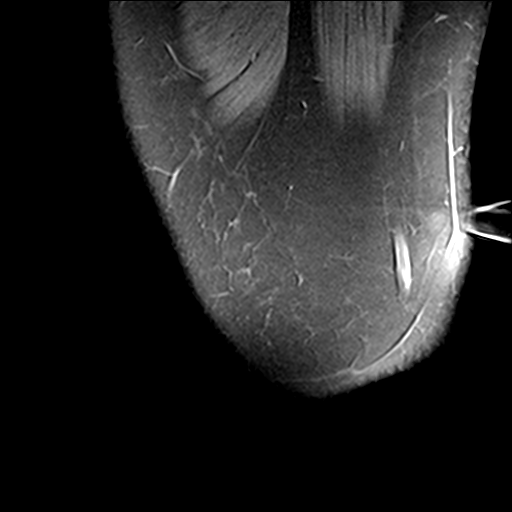
[im 5/28]
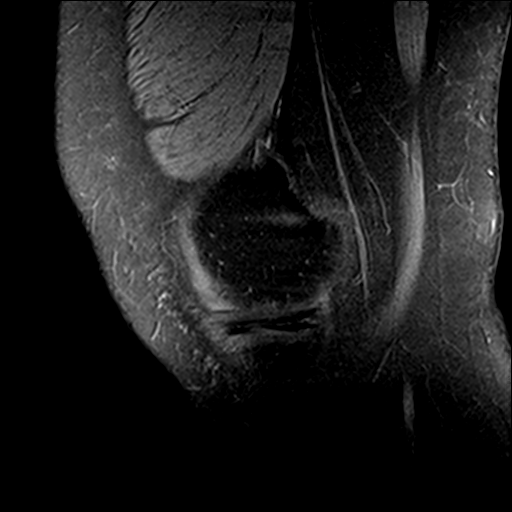
[im 10/28]
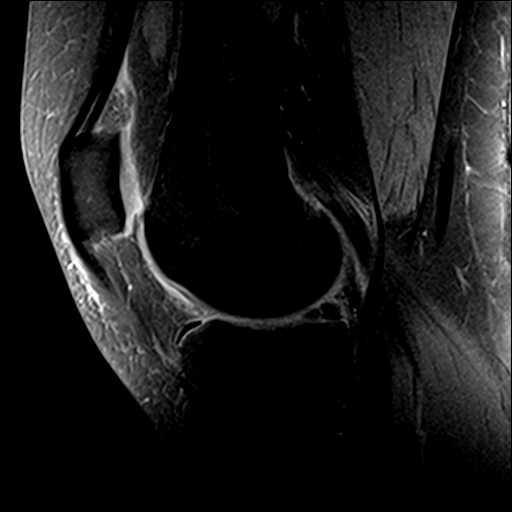
[im 14/28]
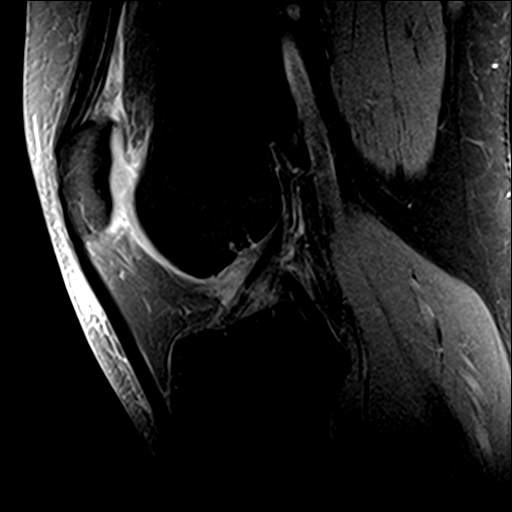
[im 19/28]
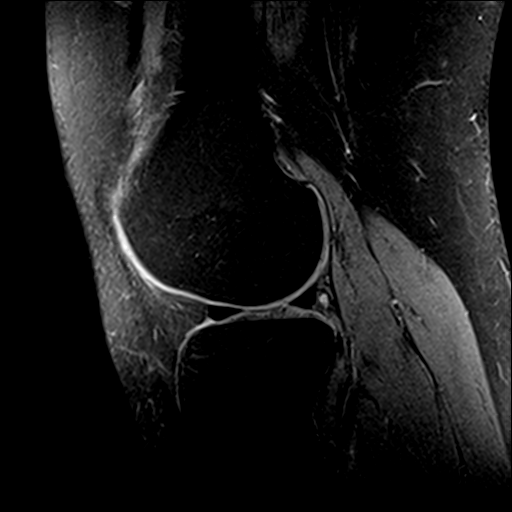
[im 23/28]
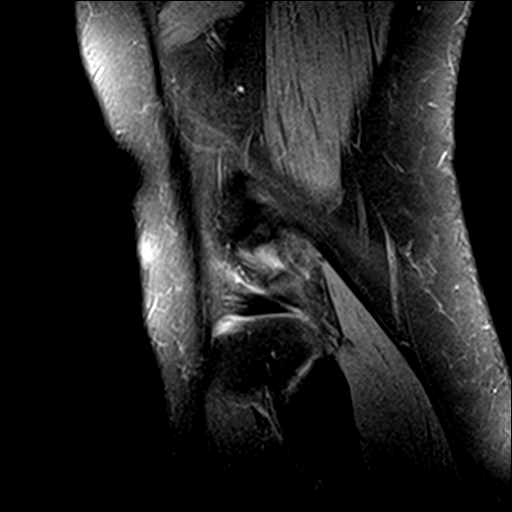
[im 28/28]
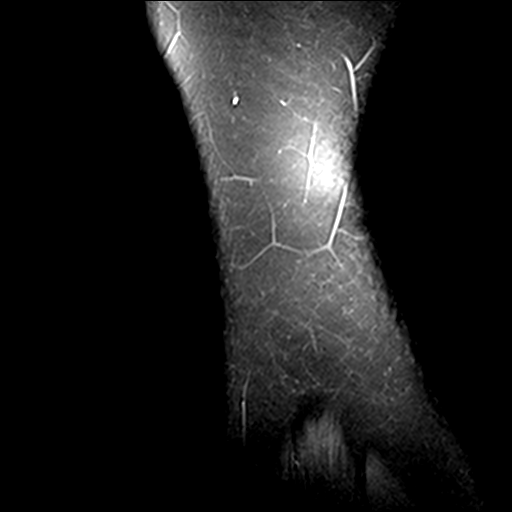

[Series 8: PD fat-sat · coronal · 3.0mm · 0.29mm/px · 8 of 33 slices shown (2 of 2)]
[im 1/33]
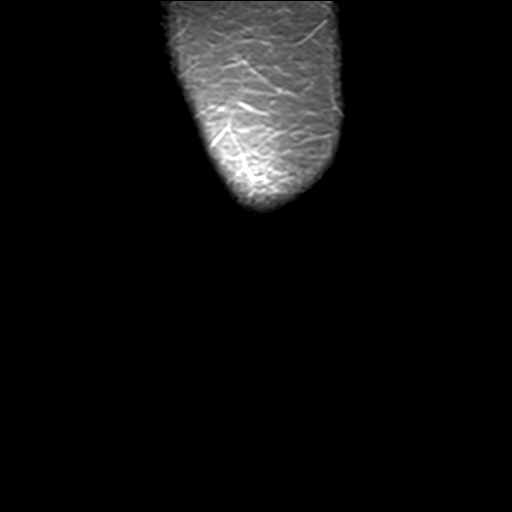
[im 5/33]
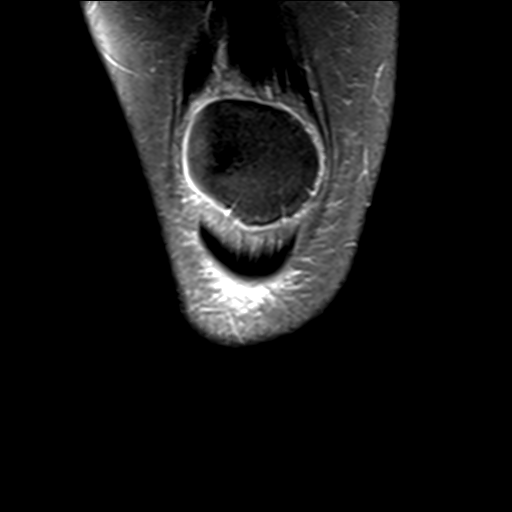
[im 10/33]
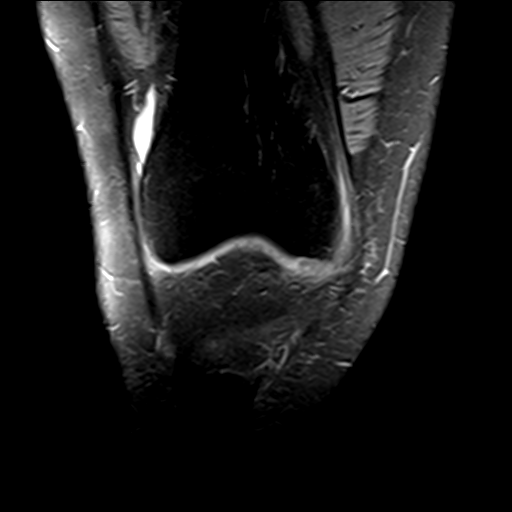
[im 14/33]
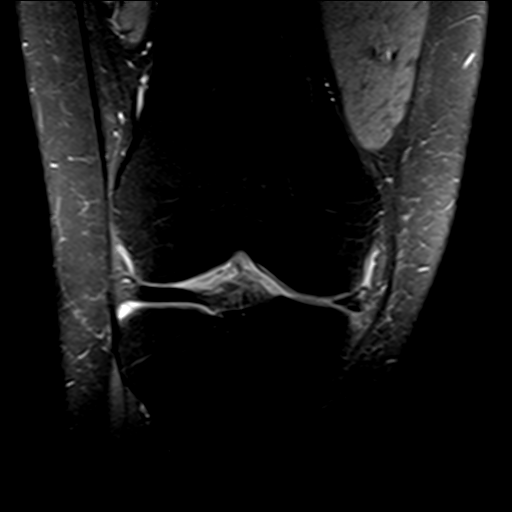
[im 19/33]
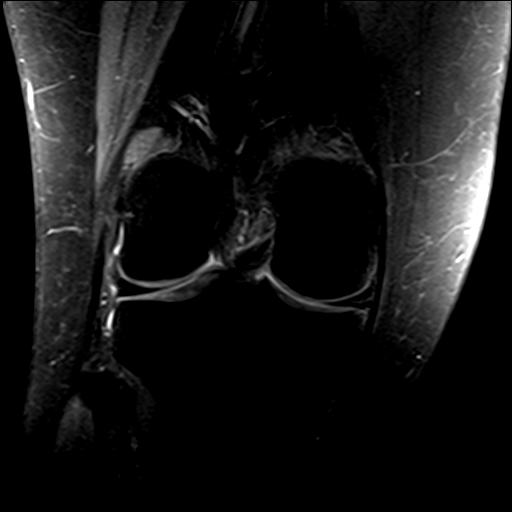
[im 23/33]
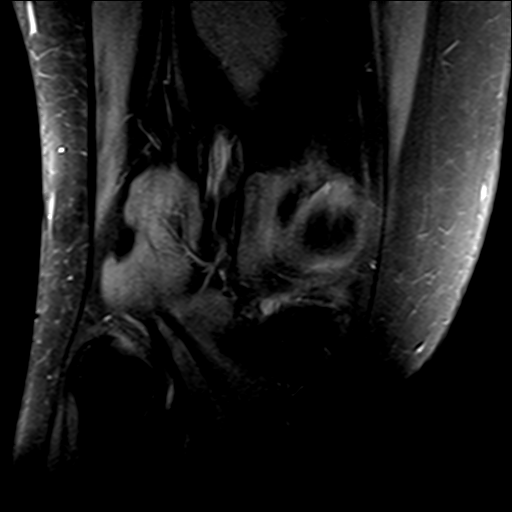
[im 28/33]
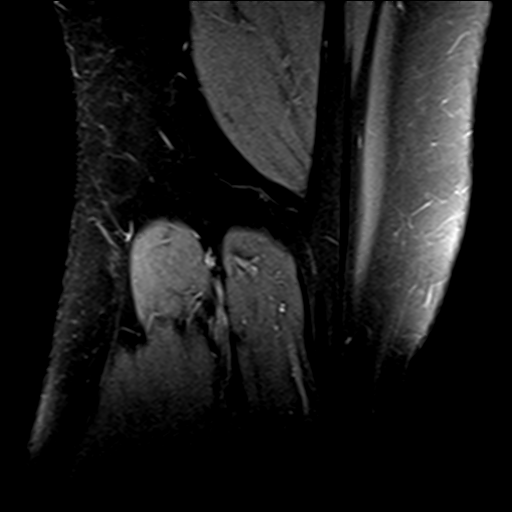
[im 33/33]
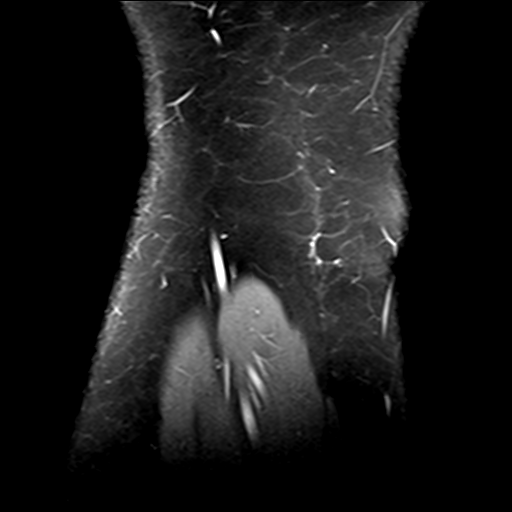

[21 of 40 positions shown; findings below may reference images not displayed]

FINDINGS: MENISCI

Medial meniscus: Longitudinal tear of the posterior horn. Possible
additional longitudinal tear of the peripheral body.

Lateral meniscus:  Intact.

LIGAMENTS

Cruciates:  Intact ACL and PCL.

Collaterals: Medial collateral ligament is intact. Lateral
collateral ligament complex is intact.

CARTILAGE

Patellofemoral:  Normal.

Medial:  Normal.

Lateral:  Normal.

Joint: No significant joint effusion. Mild edema in the
superolateral aspect of Hoffa's fat. No plical thickening.

Popliteal Fossa:  No Baker cyst. Intact popliteus tendon.

Extensor Mechanism: Intact quadriceps tendon and patellar tendon.
Intact medial and lateral patellar retinaculum. Intact MPFL.

Bones: Trace marrow edema in the peripheral medial femoral condyle
and medial tibial plateau. No fracture or dislocation.

Other: None.
IMPRESSION: 1. Longitudinal tear of the medial meniscus posterior horn. Possible
additional longitudinal tear of the peripheral body.
2. No ligamentous injury.
3. Focal abnormal edema in the superolateral aspect of Hoffa's fat
pad, which can be seen in the setting of patellar maltracking.

## 2022-05-30 ENCOUNTER — Encounter: Payer: Self-pay | Admitting: Physician Assistant

## 2022-05-30 ENCOUNTER — Ambulatory Visit: Payer: Self-pay | Admitting: Physician Assistant

## 2022-05-30 ENCOUNTER — Ambulatory Visit: Payer: Self-pay

## 2022-05-30 VITALS — BP 133/90 | HR 95 | Temp 98.4°F | Resp 14 | Ht 66.0 in | Wt 230.0 lb

## 2022-05-30 DIAGNOSIS — Z021 Encounter for pre-employment examination: Secondary | ICD-10-CM

## 2022-05-30 LAB — POCT URINALYSIS DIPSTICK
Bilirubin, UA: NEGATIVE
Glucose, UA: NEGATIVE
Ketones, UA: NEGATIVE
Leukocytes, UA: NEGATIVE
Nitrite, UA: NEGATIVE
Protein, UA: POSITIVE — AB
Spec Grav, UA: >=1.03 — AB (ref 1.010–1.025)
Urobilinogen, UA: 1 E.U./dL
pH, UA: 6 (ref 5.0–8.0)

## 2022-05-30 NOTE — Progress Notes (Unsigned)
Pt presents today to complete [employer]LE[employer]employment physical, labs and UDS

## 2022-05-30 NOTE — Progress Notes (Signed)
   [facility] [place] [facility] ____________________________________________   None    (approximate)  I have reviewed the triage vital signs and the nursing notes.   HISTORY  Chief Complaint Employment Physical   HPI Helen Henderson is a 31 y.o. female patient presents for preemployment physical exam for [employer].  Patient was no concerns or complaints.         Past Medical History:  Diagnosis Date   Asthma     There are no problems to display for this patient.   Past Surgical History:  Procedure Laterality Date   MENISCUS REPAIR Right 2020    Prior to Admission medications   Not on File    Allergies Oxycodone  Family History  Problem Relation Age of Onset   Hypertension Mother    Diabetes Father    Hypertension Father    Diabetes Brother    Diabetes Maternal Grandmother    Breast cancer Maternal Grandmother    Prostate cancer Maternal Grandfather    Hypertension Paternal Grandmother    Diabetes Paternal Grandmother    Diabetes Paternal Grandfather     Social History Social History   Tobacco Use   Smoking status: Never   Smokeless tobacco: Never  Vaping Use   Vaping Use: Never used  Substance Use Topics   Alcohol use: Yes   Drug use: Never    Review of Systems Constitutional: No fever/chills Eyes: No visual changes. ENT: No sore throat. Cardiovascular: Denies chest pain. Respiratory: Denies shortness of breath.  Asthma Gastrointestinal: No abdominal pain.  No nausea, no vomiting.  No diarrhea.  No constipation. Genitourinary: Negative for dysuria. Musculoskeletal: Negative for back pain. Skin: Negative for rash. Neurological: Negative for headaches, focal weakness or numbness. Allergic/Immunilogical: Oxycodone ____________________________________________   PHYSICAL EXAM:  VITAL SIGNS: BP is 133/90, pulse 95, respiration 14, temperature 90.4, and patient is 98% O2 sat on room air.  Patient weighs  230 pounds and BMI is 37.12. Constitutional: Alert and oriented. Well appearing and in no acute distress. Eyes: Conjunctivae are normal. PERRL. EOMI. Head: Atraumatic. Nose: No congestion/rhinnorhea. Mouth/Throat: Mucous membranes are moist.  Oropharynx non-erythematous. Neck: No stridor.  No cervical spine tenderness to palpation. Hematological/Lymphatic/Immunilogical: No cervical lymphadenopathy. Cardiovascular: Normal rate, regular rhythm. Grossly normal heart sounds.  Good peripheral circulation. Respiratory: Normal respiratory effort.  No retractions. Lungs CTAB. Gastrointestinal: Soft and nontender. No distention. No abdominal bruits. No CVA tenderness. Genitourinary: Deferred Musculoskeletal: No lower extremity tenderness nor edema.  No joint effusions. Neurologic:  Normal speech and language. No gross focal neurologic deficits are appreciated. No gait instability. Skin:  Skin is warm, dry and intact. No rash noted. Psychiatric: Mood and affect are normal. Speech and behavior are normal.  ____________________________________________   LABS Pending ____________________________________________     ____________________________________________   INITIAL IMPRESSION / ASSESSMENT AND PLAN   As part of my medical decision making, I reviewed the following data within the electronic MEDICAL RECORD NUMBER      Discussed no acute findings on physical exam.        ____________________________________________   FINAL CLINICAL IMPRESSION  Well exam   ED Discharge Orders     None        Note:  This document was prepared using Dragon voice recognition software and may include unintentional dictation errors.

## 2022-05-30 NOTE — Progress Notes (Signed)
Pt presents today to complete BLETpre-employment physical. Pt denies any issues or concerns./CL,RMA 

## 2022-06-01 LAB — CMP12+LP+TP+TSH+6AC+CBC/D/PLT
ALT: 14 IU/L (ref 0–32)
AST: 17 IU/L (ref 0–40)
Albumin/Globulin Ratio: 1.4 (ref 1.2–2.2)
Albumin: 4.2 g/dL (ref 4.0–5.0)
Alkaline Phosphatase: 87 IU/L (ref 44–121)
BUN/Creatinine Ratio: 23 (ref 9–23)
BUN: 16 mg/dL (ref 6–20)
Basophils Absolute: 0 10*3/uL (ref 0.0–0.2)
Basos: 1 %
Bilirubin Total: <0.2 mg/dL (ref 0.0–1.2)
Calcium: 8.9 mg/dL (ref 8.7–10.2)
Chloride: 109 mmol/L — ABNORMAL HIGH (ref 96–106)
Chol/HDL Ratio: 2.9 ratio (ref 0.0–4.4)
Cholesterol, Total: 160 mg/dL (ref 100–199)
Creatinine, Ser: 0.71 mg/dL (ref 0.57–1.00)
EOS (ABSOLUTE): 0.1 10*3/uL (ref 0.0–0.4)
Eos: 1 %
Estimated CHD Risk: <0.5 times avg. (ref 0.0–1.0)
Free Thyroxine Index: 1.4 (ref 1.2–4.9)
GGT: 15 IU/L (ref 0–60)
Globulin, Total: 2.9 g/dL (ref 1.5–4.5)
Glucose: 104 mg/dL — ABNORMAL HIGH (ref 70–99)
HDL: 56 mg/dL (ref >39)
Hematocrit: 31.1 % — ABNORMAL LOW (ref 34.0–46.6)
Hemoglobin: 9.6 g/dL — ABNORMAL LOW (ref 11.1–15.9)
Immature Grans (Abs): 0 10*3/uL (ref 0.0–0.1)
Immature Granulocytes: 0 %
Iron: 13 ug/dL — ABNORMAL LOW (ref 27–159)
LDH: 153 IU/L (ref 119–226)
LDL Chol Calc (NIH): 89 mg/dL (ref 0–99)
Lymphocytes Absolute: 1.7 10*3/uL (ref 0.7–3.1)
Lymphs: 30 %
MCH: 22 pg — ABNORMAL LOW (ref 26.6–33.0)
MCHC: 30.9 g/dL — ABNORMAL LOW (ref 31.5–35.7)
MCV: 71 fL — ABNORMAL LOW (ref 79–97)
Monocytes Absolute: 0.4 10*3/uL (ref 0.1–0.9)
Monocytes: 6 %
Neutrophils Absolute: 3.5 10*3/uL (ref 1.4–7.0)
Neutrophils: 62 %
Phosphorus: 2.8 mg/dL — ABNORMAL LOW (ref 3.0–4.3)
Platelets: 372 10*3/uL (ref 150–450)
Potassium: 4.2 mmol/L (ref 3.5–5.2)
RBC: 4.36 x10E6/uL (ref 3.77–5.28)
RDW: 15 % (ref 11.7–15.4)
Sodium: 141 mmol/L (ref 134–144)
T3 Uptake Ratio: 25 % (ref 24–39)
T4, Total: 5.7 ug/dL (ref 4.5–12.0)
TSH: 1.59 u[IU]/mL (ref 0.450–4.500)
Total Protein: 7.1 g/dL (ref 6.0–8.5)
Triglycerides: 76 mg/dL (ref 0–149)
Uric Acid: 3.6 mg/dL (ref 2.6–6.2)
VLDL Cholesterol Cal: 15 mg/dL (ref 5–40)
WBC: 5.7 10*3/uL (ref 3.4–10.8)
eGFR: 117 mL/min/{1.73_m2} (ref >59)

## 2022-06-01 LAB — QUANTIFERON-TB GOLD PLUS
QuantiFERON Mitogen Value: >10 IU/mL
QuantiFERON Nil Value: 0 IU/mL
QuantiFERON TB1 Ag Value: 0 IU/mL
QuantiFERON TB2 Ag Value: 0 IU/mL
QuantiFERON-TB Gold Plus: NEGATIVE

## 2022-06-01 LAB — HEPATITIS B SURFACE ANTIBODY,QUALITATIVE: Hep B Surface Ab, Qual: REACTIVE

## 2023-02-21 NOTE — Progress Notes (Signed)
Pt has PCP. Pt declined SDOH. Pt received education about BP from congregational nursing and recommened to go to urgent care.

## 2023-04-09 ENCOUNTER — Encounter: Payer: Self-pay | Admitting: *Deleted

## 2023-04-09 NOTE — Progress Notes (Unsigned)
Pt attended 02/20/23 screening event where her b/p was 160/100. At event pt identified PCP as having the name "[name]", noted she had private insurance, and did not identify any SDOH insecurities. At the event, the event RN recommended pt f/u at urgent care for her elevated b/p but [facility] documentation does not reveal any urgent care or PCP encounters visible in [facility] after the 02/20/23 event, nor any encounter with a provider named [name] in the past. Last [facility]-visible encounter for pt actually was 05/30/22, with the [facility] where her b/p was 133/90. Call made x 3 to phone number shared by pt, and message left for Helen Henderson to call health equity team members r/t event f/u. Letter sent to pt to share her event b/p result with a primary provider asap due to it being abnormally high and to offer [facility] PCP options, including the [facility] Now flyer and the community primary care clinic flyers. Pt education about HTN, low salt diet, and b/p log also sent to pt. Additional pt f/u to be scheduled per health equity protocol.

## 2023-05-29 ENCOUNTER — Encounter: Payer: Self-pay | Admitting: *Deleted

## 2023-05-29 NOTE — Progress Notes (Signed)
Pt attended 02/20/23 screening event where her b/p was 160/100. At event pt identified PCP as having the name "[name]", noted she had private insurance, and did not identify any SDOH insecurities. At the event, the event RN recommended pt f/u at urgent care for her elevated b/p but [facility] documentation does not reveal any urgent care or PCP encounters visible in [facility] after the 02/20/23 event, nor any encounter with a provider named [name] in the past. Last [facility]-visible encounter for pt actually was 05/30/22, with the [facility] where her b/p was 133/90. Health equity team member unable to contact pt by phone after initial event and again during 60 day event follow up, and VM left. 2nd Letter sent to pt to share her event b/p result with a primary provider asap due to it being abnormally high and to offer [facility] PCP options, including the [facility] flyer and the community primary care clinic flyers. Pt education about HTN, low salt diet, and b/p log also sent to pt. Additional pt f/u to be scheduled per health equity protocol

## 2023-08-28 ENCOUNTER — Ambulatory Visit: Payer: Self-pay

## 2023-08-28 DIAGNOSIS — Z0000 Encounter for general adult medical examination without abnormal findings: Secondary | ICD-10-CM

## 2023-08-28 LAB — POCT URINALYSIS DIPSTICK
Bilirubin, UA: NEGATIVE
Blood, UA: POSITIVE
Glucose, UA: NEGATIVE
Ketones, UA: NEGATIVE
Leukocytes, UA: NEGATIVE
Nitrite, UA: NEGATIVE
Protein, UA: NEGATIVE
Spec Grav, UA: 1.015 (ref 1.010–1.025)
Urobilinogen, UA: 0.2 U/dL
pH, UA: 6.5 (ref 5.0–8.0)

## 2023-08-28 NOTE — Progress Notes (Signed)
3

## 2023-08-29 LAB — CMP12+LP+TP+TSH+6AC+CBC/D/PLT
ALT: 26 [IU]/L (ref 0–32)
AST: 20 [IU]/L (ref 0–40)
Albumin: 4.3 g/dL (ref 3.9–4.9)
Alkaline Phosphatase: 121 [IU]/L (ref 44–121)
BUN/Creatinine Ratio: 14 (ref 9–23)
BUN: 11 mg/dL (ref 6–20)
Basophils Absolute: 0 10*3/uL (ref 0.0–0.2)
Basos: 0 %
Bilirubin Total: <0.2 mg/dL (ref 0.0–1.2)
Calcium: 9.3 mg/dL (ref 8.7–10.2)
Chloride: 102 mmol/L (ref 96–106)
Chol/HDL Ratio: 2.7 {ratio} (ref 0.0–4.4)
Cholesterol, Total: 174 mg/dL (ref 100–199)
Creatinine, Ser: 0.78 mg/dL (ref 0.57–1.00)
EOS (ABSOLUTE): 0.1 10*3/uL (ref 0.0–0.4)
Eos: 1 %
Estimated CHD Risk: <0.5 times avg. (ref 0.0–1.0)
Free Thyroxine Index: 1.9 (ref 1.2–4.9)
GGT: 19 [IU]/L (ref 0–60)
Globulin, Total: 3.3 g/dL (ref 1.5–4.5)
Glucose: 93 mg/dL (ref 70–99)
HDL: 65 mg/dL (ref >39)
Hematocrit: 33.3 % — ABNORMAL LOW (ref 34.0–46.6)
Hemoglobin: 9.8 g/dL — ABNORMAL LOW (ref 11.1–15.9)
Immature Grans (Abs): 0 10*3/uL (ref 0.0–0.1)
Immature Granulocytes: 0 %
Iron: 19 ug/dL — ABNORMAL LOW (ref 27–159)
LDH: 186 [IU]/L (ref 119–226)
LDL Chol Calc (NIH): 100 mg/dL — ABNORMAL HIGH (ref 0–99)
Lymphocytes Absolute: 2.2 10*3/uL (ref 0.7–3.1)
Lymphs: 35 %
MCH: 21 pg — ABNORMAL LOW (ref 26.6–33.0)
MCHC: 29.4 g/dL — ABNORMAL LOW (ref 31.5–35.7)
MCV: 72 fL — ABNORMAL LOW (ref 79–97)
Monocytes Absolute: 0.5 10*3/uL (ref 0.1–0.9)
Monocytes: 8 %
Neutrophils Absolute: 3.5 10*3/uL (ref 1.4–7.0)
Neutrophils: 56 %
Phosphorus: 3.8 mg/dL (ref 3.0–4.3)
Platelets: 427 10*3/uL (ref 150–450)
Potassium: 4.4 mmol/L (ref 3.5–5.2)
RBC: 4.66 x10E6/uL (ref 3.77–5.28)
RDW: 15.9 % — ABNORMAL HIGH (ref 11.7–15.4)
Sodium: 138 mmol/L (ref 134–144)
T3 Uptake Ratio: 27 % (ref 24–39)
T4, Total: 7.2 ug/dL (ref 4.5–12.0)
TSH: 4.33 u[IU]/mL (ref 0.450–4.500)
Total Protein: 7.6 g/dL (ref 6.0–8.5)
Triglycerides: 46 mg/dL (ref 0–149)
Uric Acid: 3.6 mg/dL (ref 2.6–6.2)
VLDL Cholesterol Cal: 9 mg/dL (ref 5–40)
WBC: 6.3 10*3/uL (ref 3.4–10.8)
eGFR: 104 mL/min/{1.73_m2} (ref >59)

## 2023-09-04 ENCOUNTER — Ambulatory Visit: Payer: Self-pay | Admitting: Physician Assistant

## 2023-09-04 ENCOUNTER — Encounter: Payer: Self-pay | Admitting: Physician Assistant

## 2023-09-04 VITALS — BP 142/92 | HR 91 | Temp 98.4°F | Resp 16 | Ht 66.0 in | Wt 241.0 lb

## 2023-09-04 DIAGNOSIS — Z0000 Encounter for general adult medical examination without abnormal findings: Secondary | ICD-10-CM

## 2023-09-04 NOTE — Progress Notes (Signed)
 Pt presents today to complete physical, Pt did not voice any concerns at this time Gretel Acre

## 2023-09-04 NOTE — Progress Notes (Signed)
 [facility] [place] [facility] ____________________________________________   None    (approximate)  I have reviewed the triage vital signs and the nursing notes.   HISTORY  Chief Complaint No chief complaint on file.   HPI Helen Henderson is a 33 y.o. female patient presents for annual physical exam for [employer] work         Past Medical History:  Diagnosis Date   Asthma     There are no active problems to display for this patient.   Past Surgical History:  Procedure Laterality Date   MENISCUS REPAIR Right 2020    Prior to Admission medications   Not on File    Allergies Oxycodone  Family History  Problem Relation Age of Onset   Hypertension Mother    Diabetes Father    Hypertension Father    Diabetes Brother    Diabetes Maternal Grandmother    Breast cancer Maternal Grandmother    Prostate cancer Maternal Grandfather    Hypertension Paternal Grandmother    Diabetes Paternal Grandmother    Diabetes Paternal Grandfather     Social History Social History   Tobacco Use   Smoking status: Never   Smokeless tobacco: Never  Vaping Use   Vaping status: Never Used  Substance Use Topics   Alcohol use: Yes   Drug use: Never    Review of Systems Constitutional: No fever/chills Eyes: No visual changes. ENT: No sore throat. Cardiovascular: Denies chest pain. Respiratory: Denies shortness of breath. Gastrointestinal: No abdominal pain.  No nausea, no vomiting.  No diarrhea.  No constipation. Genitourinary: Negative for dysuria. Musculoskeletal: Negative for back pain. Skin: Negative for rash. Neurological: Negative for headaches, focal weakness or numbness. Hematological/Lymphatic: Anemia Allergic/Immunilogical: Oxycodone ____________________________________________   PHYSICAL EXAM:  VITAL SIGNS: BP 142/92BP. 142/92. Data is abnormal. Taken on 09/04/23 9:12 AM  Cuff Size Large  Pulse Rate 91  Temp 98.4 F (36.9 C)   Temp Source Temporal  Weight 241 lb (109.3 kg)  Height 5\' 6"  (1.676 m)  Resp 16  SpO2 95 %   BMI: 38.90 kg/m2  BSA: 2.26 m2   Constitutional: Alert and oriented. Well appearing and in no acute distress. Eyes: Conjunctivae are normal. PERRL. EOMI. Head: Atraumatic. Nose: No congestion/rhinnorhea. Mouth/Throat: Mucous membranes are moist.  Oropharynx non-erythematous. Neck: No stridor.  No cervical spine tenderness to palpation. Hematological/Lymphatic/Immunilogical: No cervical lymphadenopathy. Cardiovascular: Normal rate, regular rhythm. Grossly normal heart sounds.  Good peripheral circulation. Respiratory: Normal respiratory effort.  No retractions. Lungs CTAB. Gastrointestinal: Soft and nontender. No distention. No abdominal bruits. No CVA tenderness. Genitourinary: Deferred Musculoskeletal: No lower extremity tenderness nor edema.  No joint effusions. Neurologic:  Normal speech and language. No gross focal neurologic deficits are appreciated. No gait instability. Skin:  Skin is warm, dry and intact. No rash noted. Psychiatric: Mood and affect are normal. Speech and behavior are normal.  ____________________________________________   LABS      Component Ref Range & Units (hover) 7 d ago 1 yr ago  Color, UA Yellow dark yellow  Clarity, UA Clear cloudy  Glucose, UA Negative Negative  Bilirubin, UA Negative neg  Ketones, UA Negative neg  Spec Grav, UA 1.015 >=1.030 Abnormal   Blood, UA Positive 3+ CM  Comment: 3+ (Menstrual Cycle)  pH, UA 6.5 6.0  Protein, UA Negative Positive Abnormal  CM  Urobilinogen, UA 0.2 1.0 CM  Nitrite, UA Negative neg  Leukocytes, UA Negative Negative  Appearance    Odor  Specimen Collected: 08/28/23 09:09 Last Resulted: 08/28/23 09:09      Lab Flowsheet      Order Details      View Encounter      Lab and Collection Details      Routing      Result History    View All Conversations on this Encounter    CM=Additional  comments    Result Care Coordination   Patient Communication   Add Comments   Not seen Back to Top   Other Results from 08/28/2023   Contains abnormal data CMP12+LP+TP+TSH+6AC+CBC/D/Plt Order: [id number]  Status: Final result     Next appt: None     Dx: Routine adult health maintenance     Test Result Released: No (inaccessible in MyChart)   0 Result Notes      Component Ref Range & Units (hover) 7 d ago 1 yr ago  Glucose 93 104 High   Uric Acid 3.6 3.6 CM  Comment:            Therapeutic target for gout patients: <6.0  BUN 11 16  Creatinine, Ser 0.78 0.71  eGFR 104 117  BUN/Creatinine Ratio 14 23  Sodium 138 141  Potassium 4.4 4.2  Chloride 102 109 High   Calcium 9.3 8.9  Phosphorus 3.8 2.8 Low   Total Protein 7.6 7.1  Albumin 4.3 4.2 R  Globulin, Total 3.3 2.9  Bilirubin Total <0.2 <0.2  Alkaline Phosphatase 121 87  LDH 186 153  AST 20 17  ALT 26 14  GGT 19 15  Iron 19 Low  13 Low   Cholesterol, Total 174 160  Triglycerides 46 76  HDL 65 56  VLDL Cholesterol Cal 9 15  LDL Chol Calc (NIH) 100 High  89  Chol/HDL Ratio 2.7 2.9 CM  Comment:                                   T. Chol/HDL Ratio                                             Men  Women                               1/2 Avg.Risk  3.4    3.3                                   Avg.Risk  5.0    4.4                                2X Avg.Risk  9.6    7.1                                3X Avg.Risk 23.4   11.0  Estimated CHD Risk  < 0.5  < 0.5 CM  Comment: The CHD Risk is based on the T. Chol/HDL ratio. Other factors affect CHD Risk such as hypertension, smoking, diabetes, severe obesity, and family history of premature CHD.  TSH 4.330 1.590  T4, Total 7.2  5.7  T3 Uptake Ratio 27 25  Free Thyroxine Index 1.9 1.4  WBC 6.3 5.7  RBC 4.66 4.36  Hemoglobin 9.8 Low  9.6 Low   Hematocrit 33.3 Low  31.1 Low   MCV 72 Low  71 Low   MCH 21.0 Low  22.0 Low   MCHC 29.4 Low  30.9 Low   RDW 15.9 High  15.0   Platelets 427 372  Neutrophils 56 62  Lymphs 35 30  Monocytes 8 6  Eos 1 1  Basos 0 1  Neutrophils Absolute 3.5 3.5  Lymphocytes Absolute 2.2 1.7  Monocytes Absolute 0.5 0.4  EOS (ABSOLUTE) 0.1 0.1  Basophils Absolute 0.0 0.0  Immature Granulocytes 0 0  Immature Grans (Abs) 0.0 0.0         ____________________________________________  EKG Sinus rhythm at 81 bpm  ____________________________________________    ____________________________________________   INITIAL IMPRESSION / ASSESSMENT AND PLAN  As part of my medical decision making, I reviewed the following data within the electronic MEDICAL RECORD NUMBER      No acute findings on physical physical exam and an EKG.  Labs reveal mild anemia.  Patient advised over-the-counter iron supplements and follow-up in 1 month.        ____________________________________________   FINAL CLINICAL IMPRESSION Well exam   ED Discharge Orders          Ordered    EKG 12-Lead        09/04/23 1610             Note:  This document was prepared using Dragon voice recognition software and may include unintentional dictation errors.

## 2023-09-24 ENCOUNTER — Encounter: Payer: Self-pay | Admitting: *Deleted

## 2023-09-24 NOTE — Progress Notes (Signed)
 Pt attended 02/20/23 screening event where her b/p was 160/100. At event pt identified PCP as having the name "[name]", noted she had private insurance, noted she was not a smoker and did not identify any SDOH insecurities. At the event, the event RN recommended pt f/u at urgent care for her elevated b/p but [facility] documentation does not reveal any urgent care or PCP encounters visible in [facility] after the 02/20/23 event, nor any encounter with a provider named [name] visible in [facility] to date. Last [facility]-visible encounter for pt actually was 05/30/22, with the [facility] where her b/p was 133/90. Health equity team member unable to contact pt by phone after initial event and again during 60 day event follow up, and VM left. 2nd Letter sent to pt to share her event b/p result with a primary provider asap due to it being abnormally high and to offer [facility] PCP options, including the Get Care Now flyer and the community primary care clinic flyers. Pt education about HTN, low salt diet, and b/p log also sent to pt. During the 6 month f/u today, Chart review indicates pt was seen on 09/04/23 for her routine adult physical again by Nona Dell, PA-C with the [facility] physicians, when her b/p was 142/92 and provider notes indicated pt was to f/u again in one month. Since pt has PCP, insurance, and no SDOH insecurities noted, no additional health equity team support scheduled at this time.

## 2023-11-14 ENCOUNTER — Ambulatory Visit: Payer: Self-pay | Admitting: Physician Assistant

## 2023-11-14 ENCOUNTER — Encounter: Payer: Self-pay | Admitting: Physician Assistant

## 2023-11-14 VITALS — BP 130/80 | HR 68 | Temp 98.1°F | Resp 16 | Wt 240.0 lb

## 2023-11-14 DIAGNOSIS — R030 Elevated blood-pressure reading, without diagnosis of hypertension: Secondary | ICD-10-CM

## 2023-11-14 DIAGNOSIS — R519 Headache, unspecified: Secondary | ICD-10-CM

## 2023-11-14 NOTE — Progress Notes (Signed)
   Subjective: Bitemporal headache and elevated blood pressure.    Patient ID: Helen Henderson, female    DOB: [birth year], 33 y.o.   MRN: [id number]  HPI Patient complains of bitemporal headache for 2 days.  Patient states pertinent new home blood pressure machine approximate month ago and noticed fluctuating blood pressures.  Denies chest pain or dyspnea.  Unsure if headache is secondary to elevated blood pressure.  Patient states she took 600 mg of ibuprofen last night for headache and awaking this morning with no headache and then approximately an hour ago headache returned.  Denies vision change or vertigo.  No palliative measures at this time.  Family history of hypertension.  Patient also works as a Emergency planning/management officer and is worried that stress might be causing headache.  Denies anxiety.   Review of Systems Negative septa above complaint    Objective:   Physical Exam BP 130/80  BP Location Right Arm  Patient Position Sitting  Pulse Rate 68  Temp 98.1 F (36.7 C)  Weight 240 lb (108.9 kg)  Resp 16  SpO2 97 %  Pain Score 6  Pain Loc Head  No acute distress.  Normotensive at this time. HEENT is unremarkable.  Neck is supple without lymphadenopathy or bruits.  Lungs are clear to auscultation.  Heart regular rate and rhythm.       Assessment & Plan: Headache and hypertension.  Advised and milligrams ibuprofen for headache.  May alternate with extra strength Tylenol .  Record blood pressures morning and evening for the next week.  Bring machine to clinic next week for comparison readings.

## 2023-11-14 NOTE — Progress Notes (Signed)
 Stated elevated BP readings x several days.  Reports BP readings at home yesterday 160/89 and 143/103 and today 145/83 all with her automated cuff that she said is new and applied to the arm.  Today reading in clinic was manual after several minutes of sitting quietly in a chair.  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

## 2023-11-20 ENCOUNTER — Ambulatory Visit: Payer: Self-pay | Admitting: Physician Assistant

## 2024-09-08 ENCOUNTER — Encounter: Payer: Self-pay | Admitting: Physician Assistant
